# Patient Record
Sex: Female | Born: 1955 | Race: White | Hispanic: No | Smoking: Current every day smoker
Health system: Southern US, Community
[De-identification: ages and names within clinical notes are randomized; demographics above are authoritative.]

## PROBLEM LIST (undated history)

## (undated) DIAGNOSIS — Z87891 Personal history of nicotine dependence: Secondary | ICD-10-CM

## (undated) DIAGNOSIS — C50919 Malignant neoplasm of unspecified site of unspecified female breast: Secondary | ICD-10-CM

## (undated) DIAGNOSIS — C801 Malignant (primary) neoplasm, unspecified: Secondary | ICD-10-CM

## (undated) DIAGNOSIS — H04129 Dry eye syndrome of unspecified lacrimal gland: Secondary | ICD-10-CM

## (undated) DIAGNOSIS — T7840XA Allergy, unspecified, initial encounter: Secondary | ICD-10-CM

## (undated) DIAGNOSIS — E785 Hyperlipidemia, unspecified: Secondary | ICD-10-CM

## (undated) DIAGNOSIS — I10 Essential (primary) hypertension: Secondary | ICD-10-CM

## (undated) DIAGNOSIS — Z1211 Encounter for screening for malignant neoplasm of colon: Secondary | ICD-10-CM

## (undated) DIAGNOSIS — D0510 Intraductal carcinoma in situ of unspecified breast: Secondary | ICD-10-CM

## (undated) HISTORY — DX: Hyperlipidemia, unspecified: E78.5

## (undated) HISTORY — DX: Allergy, unspecified, initial encounter: T78.40XA

## (undated) HISTORY — DX: Personal history of nicotine dependence: Z87.891

## (undated) HISTORY — DX: Essential (primary) hypertension: I10

## (undated) HISTORY — DX: Encounter for screening for malignant neoplasm of colon: Z12.11

## (undated) HISTORY — DX: Malignant (primary) neoplasm, unspecified: C80.1

## (undated) HISTORY — DX: Intraductal carcinoma in situ of unspecified breast: D05.10

## (undated) HISTORY — PX: BREAST BIOPSY: SHX20

## (undated) HISTORY — DX: Dry eye syndrome of unspecified lacrimal gland: H04.129

---

## 2000-11-15 ENCOUNTER — Encounter: Payer: Self-pay | Admitting: Internal Medicine

## 2000-11-15 ENCOUNTER — Ambulatory Visit (HOSPITAL_COMMUNITY): Admission: RE | Admit: 2000-11-15 | Discharge: 2000-11-15 | Payer: Self-pay | Admitting: Internal Medicine

## 2004-09-20 ENCOUNTER — Encounter: Payer: Self-pay | Admitting: Internal Medicine

## 2004-09-20 LAB — CONVERTED CEMR LAB

## 2004-09-29 ENCOUNTER — Ambulatory Visit: Payer: Self-pay

## 2004-10-01 ENCOUNTER — Ambulatory Visit: Payer: Self-pay

## 2005-03-03 ENCOUNTER — Ambulatory Visit: Payer: Self-pay | Admitting: Internal Medicine

## 2005-04-01 ENCOUNTER — Ambulatory Visit: Payer: Self-pay | Admitting: General Surgery

## 2005-06-26 ENCOUNTER — Ambulatory Visit: Payer: Self-pay | Admitting: Internal Medicine

## 2005-09-25 ENCOUNTER — Ambulatory Visit: Payer: Self-pay | Admitting: General Surgery

## 2005-10-29 ENCOUNTER — Ambulatory Visit: Payer: Self-pay | Admitting: Internal Medicine

## 2006-09-29 ENCOUNTER — Ambulatory Visit: Payer: Self-pay

## 2006-12-10 ENCOUNTER — Ambulatory Visit: Payer: Self-pay | Admitting: Internal Medicine

## 2006-12-21 DIAGNOSIS — C50919 Malignant neoplasm of unspecified site of unspecified female breast: Secondary | ICD-10-CM

## 2006-12-21 HISTORY — DX: Malignant neoplasm of unspecified site of unspecified female breast: C50.919

## 2006-12-21 HISTORY — PX: BREAST EXCISIONAL BIOPSY: SUR124

## 2007-06-20 ENCOUNTER — Telehealth (INDEPENDENT_AMBULATORY_CARE_PROVIDER_SITE_OTHER): Payer: Self-pay | Admitting: *Deleted

## 2007-10-04 ENCOUNTER — Ambulatory Visit: Payer: Self-pay

## 2007-10-10 ENCOUNTER — Ambulatory Visit: Payer: Self-pay

## 2007-11-03 ENCOUNTER — Other Ambulatory Visit: Payer: Self-pay

## 2007-11-03 ENCOUNTER — Ambulatory Visit: Payer: Self-pay | Admitting: General Surgery

## 2007-11-04 ENCOUNTER — Ambulatory Visit: Payer: Self-pay | Admitting: Internal Medicine

## 2007-11-04 DIAGNOSIS — J45909 Unspecified asthma, uncomplicated: Secondary | ICD-10-CM | POA: Insufficient documentation

## 2007-11-04 DIAGNOSIS — R001 Bradycardia, unspecified: Secondary | ICD-10-CM | POA: Insufficient documentation

## 2007-11-04 DIAGNOSIS — J309 Allergic rhinitis, unspecified: Secondary | ICD-10-CM | POA: Insufficient documentation

## 2007-11-04 DIAGNOSIS — I10 Essential (primary) hypertension: Secondary | ICD-10-CM | POA: Insufficient documentation

## 2007-11-04 DIAGNOSIS — E785 Hyperlipidemia, unspecified: Secondary | ICD-10-CM | POA: Insufficient documentation

## 2007-11-04 DIAGNOSIS — I498 Other specified cardiac arrhythmias: Secondary | ICD-10-CM | POA: Insufficient documentation

## 2007-11-07 ENCOUNTER — Ambulatory Visit: Payer: Self-pay | Admitting: General Surgery

## 2007-11-07 ENCOUNTER — Encounter: Payer: Self-pay | Admitting: Internal Medicine

## 2007-11-07 DIAGNOSIS — C801 Malignant (primary) neoplasm, unspecified: Secondary | ICD-10-CM

## 2007-11-07 HISTORY — PX: BREAST SURGERY: SHX581

## 2007-11-07 HISTORY — DX: Malignant (primary) neoplasm, unspecified: C80.1

## 2007-11-14 ENCOUNTER — Encounter: Payer: Self-pay | Admitting: Internal Medicine

## 2007-11-22 ENCOUNTER — Encounter: Payer: Self-pay | Admitting: Internal Medicine

## 2007-12-09 ENCOUNTER — Ambulatory Visit: Payer: Self-pay | Admitting: General Surgery

## 2007-12-09 ENCOUNTER — Telehealth (INDEPENDENT_AMBULATORY_CARE_PROVIDER_SITE_OTHER): Payer: Self-pay | Admitting: *Deleted

## 2007-12-12 ENCOUNTER — Encounter: Payer: Self-pay | Admitting: Internal Medicine

## 2007-12-12 ENCOUNTER — Ambulatory Visit: Payer: Self-pay | Admitting: General Surgery

## 2007-12-19 ENCOUNTER — Encounter: Payer: Self-pay | Admitting: Internal Medicine

## 2007-12-19 ENCOUNTER — Ambulatory Visit: Payer: Self-pay | Admitting: Radiation Oncology

## 2007-12-22 ENCOUNTER — Ambulatory Visit: Payer: Self-pay | Admitting: Radiation Oncology

## 2007-12-22 HISTORY — PX: COLONOSCOPY: SHX174

## 2007-12-28 ENCOUNTER — Ambulatory Visit: Payer: Self-pay | Admitting: Radiation Oncology

## 2008-01-13 ENCOUNTER — Telehealth (INDEPENDENT_AMBULATORY_CARE_PROVIDER_SITE_OTHER): Payer: Self-pay | Admitting: *Deleted

## 2008-01-22 ENCOUNTER — Ambulatory Visit: Payer: Self-pay | Admitting: Radiation Oncology

## 2008-02-19 ENCOUNTER — Ambulatory Visit: Payer: Self-pay | Admitting: Radiation Oncology

## 2008-03-21 ENCOUNTER — Ambulatory Visit: Payer: Self-pay | Admitting: Radiation Oncology

## 2008-04-20 ENCOUNTER — Ambulatory Visit: Payer: Self-pay | Admitting: Radiation Oncology

## 2008-04-23 ENCOUNTER — Ambulatory Visit: Payer: Self-pay | Admitting: General Surgery

## 2008-05-04 ENCOUNTER — Ambulatory Visit: Payer: Self-pay | Admitting: Internal Medicine

## 2008-05-07 LAB — CONVERTED CEMR LAB
ALT: 19 units/L (ref 0–35)
AST: 21 units/L (ref 0–37)
Albumin: 4 g/dL (ref 3.5–5.2)
Alkaline Phosphatase: 66 units/L (ref 39–117)
BUN: 9 mg/dL (ref 6–23)
Basophils Absolute: 0 10*3/uL (ref 0.0–0.1)
Basophils Relative: 0.2 % (ref 0.0–1.0)
Bilirubin, Direct: 0.1 mg/dL (ref 0.0–0.3)
CO2: 30 meq/L (ref 19–32)
Calcium: 9.6 mg/dL (ref 8.4–10.5)
Chloride: 110 meq/L (ref 96–112)
Cholesterol: 209 mg/dL (ref 0–200)
Creatinine, Ser: 0.6 mg/dL (ref 0.4–1.2)
Direct LDL: 126.2 mg/dL
Eosinophils Absolute: 0.3 10*3/uL (ref 0.0–0.7)
Eosinophils Relative: 4.9 % (ref 0.0–5.0)
GFR calc Af Amer: 137 mL/min
GFR calc non Af Amer: 113 mL/min
Glucose, Bld: 100 mg/dL — ABNORMAL HIGH (ref 70–99)
HCT: 39.3 % (ref 36.0–46.0)
HDL: 47.7 mg/dL (ref >39.0)
Hemoglobin: 13.5 g/dL (ref 12.0–15.0)
Lymphocytes Relative: 16.7 % (ref 12.0–46.0)
MCHC: 34.3 g/dL (ref 30.0–36.0)
MCV: 102.6 fL — ABNORMAL HIGH (ref 78.0–100.0)
Monocytes Absolute: 0.5 10*3/uL (ref 0.1–1.0)
Monocytes Relative: 8.3 % (ref 3.0–12.0)
Neutro Abs: 3.8 10*3/uL (ref 1.4–7.7)
Neutrophils Relative %: 69.9 % (ref 43.0–77.0)
Phosphorus: 3.8 mg/dL (ref 2.3–4.6)
Platelets: 227 10*3/uL (ref 150–400)
Potassium: 3.8 meq/L (ref 3.5–5.1)
RBC: 3.83 M/uL — ABNORMAL LOW (ref 3.87–5.11)
RDW: 11.7 % (ref 11.5–14.6)
Sodium: 146 meq/L — ABNORMAL HIGH (ref 135–145)
TSH: 1.01 microintl units/mL (ref 0.35–5.50)
Total Bilirubin: 0.7 mg/dL (ref 0.3–1.2)
Total CHOL/HDL Ratio: 4.4
Total Protein: 6.7 g/dL (ref 6.0–8.3)
Triglycerides: 121 mg/dL (ref 0–149)
VLDL: 24 mg/dL (ref 0–40)
WBC: 5.5 10*3/uL (ref 4.5–10.5)

## 2008-05-10 ENCOUNTER — Encounter: Payer: Self-pay | Admitting: Internal Medicine

## 2008-06-19 ENCOUNTER — Telehealth (INDEPENDENT_AMBULATORY_CARE_PROVIDER_SITE_OTHER): Payer: Self-pay | Admitting: *Deleted

## 2008-08-06 ENCOUNTER — Encounter: Payer: Self-pay | Admitting: Internal Medicine

## 2008-08-06 ENCOUNTER — Ambulatory Visit: Payer: Self-pay | Admitting: General Surgery

## 2008-08-07 ENCOUNTER — Telehealth: Payer: Self-pay | Admitting: Internal Medicine

## 2008-09-20 ENCOUNTER — Ambulatory Visit: Payer: Self-pay | Admitting: Radiation Oncology

## 2008-09-25 ENCOUNTER — Telehealth: Payer: Self-pay | Admitting: Internal Medicine

## 2008-09-26 ENCOUNTER — Encounter: Payer: Self-pay | Admitting: Internal Medicine

## 2008-10-03 ENCOUNTER — Ambulatory Visit: Payer: Self-pay | Admitting: General Surgery

## 2008-10-12 ENCOUNTER — Encounter: Payer: Self-pay | Admitting: Internal Medicine

## 2008-10-15 ENCOUNTER — Encounter: Payer: Self-pay | Admitting: Internal Medicine

## 2008-11-21 ENCOUNTER — Ambulatory Visit: Payer: Self-pay | Admitting: Internal Medicine

## 2008-12-20 ENCOUNTER — Telehealth: Payer: Self-pay | Admitting: Family Medicine

## 2009-01-15 ENCOUNTER — Encounter: Payer: Self-pay | Admitting: Internal Medicine

## 2009-01-29 ENCOUNTER — Ambulatory Visit: Payer: Self-pay | Admitting: Emergency Medicine

## 2009-02-18 ENCOUNTER — Telehealth: Payer: Self-pay | Admitting: Internal Medicine

## 2009-07-05 ENCOUNTER — Ambulatory Visit: Payer: Self-pay | Admitting: Internal Medicine

## 2009-07-05 DIAGNOSIS — G2581 Restless legs syndrome: Secondary | ICD-10-CM | POA: Insufficient documentation

## 2009-07-08 LAB — CONVERTED CEMR LAB
ALT: 13 units/L (ref 0–35)
AST: 16 units/L (ref 0–37)
Albumin: 4.2 g/dL (ref 3.5–5.2)
Alkaline Phosphatase: 91 units/L (ref 39–117)
BUN: 16 mg/dL (ref 6–23)
Basophils Absolute: 0 10*3/uL (ref 0.0–0.1)
Basophils Relative: 1 % (ref 0–1)
CO2: 23 meq/L (ref 19–32)
Calcium: 9.1 mg/dL (ref 8.4–10.5)
Chloride: 109 meq/L (ref 96–112)
Creatinine, Ser: 1 mg/dL (ref 0.40–1.20)
Eosinophils Absolute: 0.3 10*3/uL (ref 0.0–0.7)
Eosinophils Relative: 5 % (ref 0–5)
Glucose, Bld: 90 mg/dL (ref 70–99)
HCT: 37.6 % (ref 36.0–46.0)
Hemoglobin: 13.1 g/dL (ref 12.0–15.0)
Lymphocytes Relative: 21 % (ref 12–46)
Lymphs Abs: 1.3 10*3/uL (ref 0.7–4.0)
MCHC: 34.8 g/dL (ref 30.0–36.0)
MCV: 98.4 fL (ref 78.0–100.0)
Monocytes Absolute: 0.4 10*3/uL (ref 0.1–1.0)
Monocytes Relative: 7 % (ref 3–12)
Neutro Abs: 4.1 10*3/uL (ref 1.7–7.7)
Neutrophils Relative %: 66 % (ref 43–77)
Platelets: 262 10*3/uL (ref 150–400)
Potassium: 4.2 meq/L (ref 3.5–5.3)
RBC: 3.82 M/uL — ABNORMAL LOW (ref 3.87–5.11)
RDW: 13.1 % (ref 11.5–15.5)
Sodium: 144 meq/L (ref 135–145)
TSH: 0.945 microintl units/mL (ref 0.350–4.500)
Total Bilirubin: 0.3 mg/dL (ref 0.3–1.2)
Total Protein: 7 g/dL (ref 6.0–8.3)
WBC: 6.2 10*3/uL (ref 4.0–10.5)

## 2009-07-15 ENCOUNTER — Encounter: Payer: Self-pay | Admitting: Internal Medicine

## 2009-10-07 ENCOUNTER — Ambulatory Visit: Payer: Self-pay | Admitting: General Surgery

## 2009-10-15 ENCOUNTER — Encounter: Payer: Self-pay | Admitting: Internal Medicine

## 2009-11-27 ENCOUNTER — Telehealth (INDEPENDENT_AMBULATORY_CARE_PROVIDER_SITE_OTHER): Payer: Self-pay | Admitting: *Deleted

## 2010-08-12 ENCOUNTER — Ambulatory Visit: Payer: Self-pay | Admitting: Internal Medicine

## 2010-08-14 LAB — CONVERTED CEMR LAB
ALT: 14 units/L (ref 0–35)
AST: 15 units/L (ref 0–37)
Albumin: 4.4 g/dL (ref 3.5–5.2)
Alkaline Phosphatase: 76 units/L (ref 39–117)
BUN: 20 mg/dL (ref 6–23)
Basophils Absolute: 0 10*3/uL (ref 0.0–0.1)
Basophils Relative: 1 % (ref 0–1)
CO2: 26 meq/L (ref 19–32)
Calcium: 9.7 mg/dL (ref 8.4–10.5)
Chloride: 105 meq/L (ref 96–112)
Creatinine, Ser: 0.81 mg/dL (ref 0.40–1.20)
Eosinophils Absolute: 0.3 10*3/uL (ref 0.0–0.7)
Eosinophils Relative: 4 % (ref 0–5)
Glucose, Bld: 110 mg/dL — ABNORMAL HIGH (ref 70–99)
HCT: 36 % (ref 36.0–46.0)
Hemoglobin: 12 g/dL (ref 12.0–15.0)
Lymphocytes Relative: 22 % (ref 12–46)
Lymphs Abs: 1.4 10*3/uL (ref 0.7–4.0)
MCHC: 33.3 g/dL (ref 30.0–36.0)
MCV: 98.9 fL (ref 78.0–100.0)
Monocytes Absolute: 0.5 10*3/uL (ref 0.1–1.0)
Monocytes Relative: 8 % (ref 3–12)
Neutro Abs: 4.4 10*3/uL (ref 1.7–7.7)
Neutrophils Relative %: 66 % (ref 43–77)
Platelets: 274 10*3/uL (ref 150–400)
Potassium: 4.4 meq/L (ref 3.5–5.3)
RBC: 3.64 M/uL — ABNORMAL LOW (ref 3.87–5.11)
RDW: 12.7 % (ref 11.5–15.5)
Sodium: 140 meq/L (ref 135–145)
TSH: 1.107 microintl units/mL (ref 0.350–4.500)
Total Bilirubin: 0.3 mg/dL (ref 0.3–1.2)
Total Protein: 6.9 g/dL (ref 6.0–8.3)
WBC: 6.6 10*3/uL (ref 4.0–10.5)

## 2010-10-09 ENCOUNTER — Ambulatory Visit: Payer: Self-pay | Admitting: General Surgery

## 2010-10-16 ENCOUNTER — Encounter: Payer: Self-pay | Admitting: Internal Medicine

## 2010-10-20 ENCOUNTER — Telehealth: Payer: Self-pay | Admitting: Internal Medicine

## 2011-01-20 NOTE — Assessment & Plan Note (Signed)
Summary: med refill/dlo R/S FROM 08/15/10   Vital Signs:  Patient profile:   55 year old female Weight:      139 pounds BMI:     24.71 Temp:     98.2 degrees F oral Pulse rate:   76 / minute Pulse rhythm:   regular BP sitting:   128 / 80  (left arm) Cuff size:   regular  Vitals Entered By: Mervin Hack CMA Duncan Dull) (August 12, 2010 11:07 AM) CC: medication refill   History of Present Illness: DOing okay  Dr Welton Flakes has changed her emergency inhaler He wants to change her regular inhaler as well She likes the powder of the advair Does have some mild chronic cough Very sensitive to odors---perfume, candles, etc Trying to use peak flow meter to monitor has chronic SOB  No problems with BP meds No chest pain  Still as leg pain--random Not with exertion No swelling  Using singulair and other allergy regimen  Still sees Dr Lemar Livings for breast exams Gyn is Dr Dingfielter in [place]    Allergies: 1)  Altace (Ramipril) 2)  Levaquin (Levofloxacin) 3)  Qvar (Beclomethasone Dipropionate)  Past History:  Past medical, surgical, family and social histories (including risk factors) reviewed for relevance to current acute and chronic problems.  Past Medical History: Reviewed history from 07/05/2009 and no changes required. Allergic/irritant  rhinitis Asthma--cough variant Hyperlipidemia Hypertension DCIS-----------partial mastectomy and RT-----------------Dr Byrnett  [facility])   [phone]  Past Surgical History: Reviewed history from 05/04/2008 and no changes required. 1998    Breast biopsy negative - Byrnett 3/00     Left breast biopsy- stereotactic 10/05   Left breast biopsy - (-) Byrnett 11/08   Left breast biopsy--L partial mastectomy for DCIS  Family History: Reviewed history from 12/19/2007 and no changes required. Father: Died 1998-12-18 coronary disease, HTN, DM, glaucoma, diabetic retinopathy,  Mother: HTN, rheumatoid  arthritis Maternal GM--  Coronary disease Maternal GF--  died of CA  Social History: Reviewed history from 12/19/2007 and no changes required. Marital Status: Married Children: None Occupation: Financial planner Alcohol use-yes, occasional Current Smoker, 4-5 a day, discussed  Review of Systems       Lesion on left foot---Dr Regal tried to aspirate fluid but didn't get any weight is stable sleeps okay unless legs act up  Physical Exam  General:  alert and normal appearance.   Neck:  supple, no masses, no thyromegaly, no carotid bruits, and no cervical lymphadenopathy.   Lungs:  normal respiratory effort, no intercostal retractions, no accessory muscle use, normal breath sounds, no crackles, and no wheezes.   Heart:  normal rate, regular rhythm, no murmur, and no gallop.   Pulses:  1+ in feet Extremities:  no edema Skin:  apparent cyst on dorsum of left foot Psych:  normally interactive, good eye contact, not anxious appearing, and not depressed appearing.     Impression & Recommendations:  Problem # 1:  HYPERTENSION (ICD-401.9) Assessment Unchanged  good control due for labs  Her updated medication list for this problem includes:    Cozaar 100 Mg Tabs (Losartan potassium) .Marland Kitchen... 1 tab daily for high blood pressure    Ziac 10-6.25 Mg Tabs (Bisoprolol-hydrochlorothiazide) .Marland Kitchen... Take one by mouth once a day    Norvasc 10 Mg Tabs (Amlodipine besylate) .Marland Kitchen... Take one by mouth once a day  BP today: 128/80 Prior BP: 128/80 (07/05/2009)  Labs Reviewed: K+: 4.2 (07/05/2009) Creat: : 1.00 (07/05/2009)  Chol: 209 (05/04/2008)   HDL: 47.7 (05/04/2008)   LDL: DEL (05/04/2008)   TG: 121 (05/04/2008)  Orders: TLB-Renal Function Panel (80069-RENAL) TLB-CBC Platelet - w/Differential (85025-CBCD) TLB-Hepatic/Liver Function Pnl (80076-HEPATIC) TLB-TSH (Thyroid Stimulating Hormone) (84443-TSH) Venipuncture (16109)  Problem # 2:  RESTLESS LEG SYNDROME  (ICD-333.94) Assessment: Comment Only does have some daytime symptoms Not clear cut will renew the clonazepam that she uses occ  Problem # 3:  HYPERLIPIDEMIA (ICD-272.4) Assessment: Comment Only just  on fish oil  Labs Reviewed: SGOT: 16 (07/05/2009)   SGPT: 13 (07/05/2009)   HDL:47.7 (05/04/2008)  LDL:DEL (05/04/2008)  Chol:209 (05/04/2008)  Trig:121 (05/04/2008)  Problem # 4:  ASTHMA (ICD-493.90) Assessment: Comment Only ongoing cough Dr Welton Flakes manages  The following medications were removed from the medication list:    Proair Hfa 108 (90 Base) Mcg/act Aers (Albuterol sulfate) .Marland Kitchen... 2 puffs 4 times daily as needed Her updated medication list for this problem includes:    Advair Diskus 100-50 Mcg/dose Misc (Fluticasone-salmeterol) ..... One inhalation twice a day.    Singulair 5 Mg Chew (Montelukast sodium) .Marland Kitchen... As needed  Complete Medication List: 1)  Cozaar 100 Mg Tabs (Losartan potassium) .Marland Kitchen.. 1 tab daily for high blood pressure 2)  Clonazepam 0.5 Mg Tabs (Clonazepam) .Marland Kitchen.. 1-2 at bedtime as needed for restless legs 3)  Ziac 10-6.25 Mg Tabs (Bisoprolol-hydrochlorothiazide) .... Take one by mouth once a day 4)  Norvasc 10 Mg Tabs (Amlodipine besylate) .... Take one by mouth once a day 5)  Fish Oil 1000 Mg Caps (Omega-3 fatty acids) .... Take 1 by mouth once daily 6)  Advair Diskus 100-50 Mcg/dose Misc (Fluticasone-salmeterol) .... One inhalation twice a day. 7)  Singulair 5 Mg Chew (Montelukast sodium) .... As needed 8)  Nasacort Aq 55 Mcg/act Aers (Triamcinolone acetonide(nasal)) .... As needed 9)  Potassium 75 Mg Tabs (Potassium) .... Once daily 10)  Zyrtec-d Allergy & Congestion 5-120 Mg Tb12 (Cetirizine-pseudoephedrine) .... Take one by mouth two times a day as needed for allergies 11)  Iron Supplement 325 (65 Fe) Mg Tabs (Ferrous sulfate) .... Once daily 12)  Calcium 500 Mg Tabs (Calcium carbonate) .... Take 1 by mouth once daily  Patient Instructions: 1)  Please  schedule a follow-up appointment in 1 year for physical Prescriptions: CLONAZEPAM 0.5 MG  TABS (CLONAZEPAM) 1-2 at bedtime as needed for restless legs  #60 x 0   Entered and Authorized by:   Cindee Salt MD   Signed by:   Cindee Salt MD on 08/12/2010   Method used:   Print then Give to Patient   RxID:   [id number]   Current Allergies (reviewed today): ALTACE (RAMIPRIL) LEVAQUIN (LEVOFLOXACIN) QVAR (BECLOMETHASONE DIPROPIONATE)  Appended Document: Orders Update    Clinical Lists Changes  Orders: Added new Service order of Specimen Handling (95621) - Signed Added new Test order of T-Hepatic Function [phone]) - Signed Added new Test order of T-Renal Function Panel [phone]) - Signed Added new Test order of T-CBC w/Diff [phone]) - Signed Added new Test order of T-TSH ([id number]) - Signed

## 2011-01-20 NOTE — Letter (Signed)
Summary: [facility]  [facility]   Imported By: Maryln Gottron 10/27/2010 13:17:34  _____________________________________________________________________  External Attachment:    Type:   Image     Comment:   External Document  Appended Document:  [facility]    Clinical Lists Changes  Observations: Added new observation of MAMMOGRAM: No specific mammographic evidence of malignancy.  Assessment: BIRADS 2. Dr Lemar Livings 1 year follow up--had removal of DCIS (10/09/2010 17:35)       Mammogram  Procedure date:  10/09/2010  Findings:      No specific mammographic evidence of malignancy.  Assessment: BIRADS 2. Dr Lemar Livings 1 year follow up--had removal of DCIS

## 2011-01-20 NOTE — Progress Notes (Signed)
Summary: Rx Spacer  Phone Note From Pharmacy Call back at [phone]   Caller: [facility] [address] # [phone]* Call For: Dr. Alphonsus Sias  Summary of Call: Patient is requesting a rx for a spacer to use with her inhaler.  Please send rx to pharmacy. Initial call taken by: Sydell Axon LPN,  October 20, 2010 1:32 PM  Follow-up for Phone Call        okay to call in Rx there are multiple in computer so hard to do electronically Just call in for reasonable generic spacer #1 x 0 Use with inhalers Follow-up by: Cindee Salt MD,  October 20, 2010 1:59 PM  Additional Follow-up for Phone Call Additional follow up Details #1::        rx sent to pharmacy Additional Follow-up by: Mervin Hack CMA Duncan Dull),  October 20, 2010 3:15 PM    New/Updated Medications: INSPIREASE MOUTHPIECE  MISC (SPACER/AERO CHAMBER MOUTHPIECE) use with inhaler Prescriptions: INSPIREASE MOUTHPIECE  MISC (SPACER/AERO CHAMBER MOUTHPIECE) use with inhaler  #1 x 0   Entered by:   Mervin Hack CMA (AAMA)   Authorized by:   Cindee Salt MD   Signed by:   Mervin Hack CMA (AAMA) on 10/20/2010   Method used:   Electronically to        [facility]. # [phone]* (retail)       [address]       [place], [place]  [address]       Ph: [phone]       Fax: [phone]   RxID:   [id number]

## 2011-06-30 ENCOUNTER — Encounter: Payer: Self-pay | Admitting: Internal Medicine

## 2011-07-01 ENCOUNTER — Ambulatory Visit (INDEPENDENT_AMBULATORY_CARE_PROVIDER_SITE_OTHER): Payer: PRIVATE HEALTH INSURANCE | Admitting: Internal Medicine

## 2011-07-01 ENCOUNTER — Encounter: Payer: Self-pay | Admitting: Internal Medicine

## 2011-07-01 DIAGNOSIS — E785 Hyperlipidemia, unspecified: Secondary | ICD-10-CM

## 2011-07-01 DIAGNOSIS — G2581 Restless legs syndrome: Secondary | ICD-10-CM

## 2011-07-01 DIAGNOSIS — J45909 Unspecified asthma, uncomplicated: Secondary | ICD-10-CM

## 2011-07-01 DIAGNOSIS — J309 Allergic rhinitis, unspecified: Secondary | ICD-10-CM

## 2011-07-01 DIAGNOSIS — I10 Essential (primary) hypertension: Secondary | ICD-10-CM

## 2011-07-01 LAB — LIPID PANEL
HDL: 61.2 mg/dL (ref >39.00)
Total CHOL/HDL Ratio: 4
Triglycerides: 129 mg/dL (ref 0.0–149.0)

## 2011-07-01 LAB — CBC WITH DIFFERENTIAL/PLATELET
Basophils Absolute: 0 10*3/uL (ref 0.0–0.1)
Basophils Relative: 0.5 % (ref 0.0–3.0)
Hemoglobin: 13.5 g/dL (ref 12.0–15.0)
Lymphocytes Relative: 15.9 % (ref 12.0–46.0)
Monocytes Relative: 6.2 % (ref 3.0–12.0)
Neutro Abs: 6.1 10*3/uL (ref 1.4–7.7)
RBC: 3.8 Mil/uL — ABNORMAL LOW (ref 3.87–5.11)
WBC: 8.3 10*3/uL (ref 4.5–10.5)

## 2011-07-01 LAB — BASIC METABOLIC PANEL
Calcium: 9.7 mg/dL (ref 8.4–10.5)
GFR: 103.17 mL/min (ref >60.00)
Sodium: 142 mEq/L (ref 135–145)

## 2011-07-01 LAB — HEPATIC FUNCTION PANEL
Alkaline Phosphatase: 75 U/L (ref 39–117)
Bilirubin, Direct: 0 mg/dL (ref 0.0–0.3)
Total Protein: 7.8 g/dL (ref 6.0–8.3)

## 2011-07-01 MED ORDER — AMLODIPINE BESYLATE 10 MG PO TABS: 10.0000 mg | ORAL_TABLET | Freq: Every day | ORAL | Status: DC

## 2011-07-01 MED ORDER — LOSARTAN POTASSIUM 100 MG PO TABS: 100.0000 mg | ORAL_TABLET | Freq: Every day | ORAL | Status: DC

## 2011-07-01 MED ORDER — CETIRIZINE-PSEUDOEPHEDRINE ER 5-120 MG PO TB12: 1.0000 | ORAL_TABLET | Freq: Two times a day (BID) | ORAL | Status: DC

## 2011-07-01 MED ORDER — BISOPROLOL-HYDROCHLOROTHIAZIDE 10-6.25 MG PO TABS: 1.0000 | ORAL_TABLET | Freq: Every day | ORAL | Status: DC

## 2011-07-01 NOTE — Assessment & Plan Note (Signed)
Just on fish oil No results found for this basename: LDLCALC

## 2011-07-01 NOTE — Assessment & Plan Note (Signed)
Okay to try med from Dr Welton Flakes Right now she just rarely uses the clonazepam

## 2011-07-01 NOTE — Assessment & Plan Note (Signed)
Reasonable control on regimen

## 2011-07-01 NOTE — Assessment & Plan Note (Signed)
Starting immunotherapy with Dr Welton Flakes

## 2011-07-01 NOTE — Progress Notes (Signed)
Subjective:    Patient ID: Brandy Hamilton, female    DOB: 1956-07-25, 55 y.o.   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 amLODipine (NORVASC) 10 MG tablet Take 10 mg by mouth daily.        . bisoprolol-hydrochlorothiazide (ZIAC) 10-6.25 MG per tablet Take 1 tablet by mouth daily.        . Calcium Carbonate (CALCIUM 500 PO) Take by mouth daily.        . cetirizine-pseudoephedrine (ZYRTEC-D) 5-120 MG per tablet Take 1 tablet by mouth 2 (two) times daily.        . clonazePAM (KLONOPIN) 0.5 MG tablet Take 0.5-1 mg by mouth as needed.        . ferrous gluconate (FERGON) 325 MG tablet Take 325 mg by mouth daily with breakfast.        . fish oil-omega-3 fatty acids 1000 MG capsule Take 2 g by mouth daily.        . Fluticasone-Salmeterol (ADVAIR DISKUS) 100-50 MCG/DOSE AEPB Inhale 1 puff into the lungs every 12 (twelve) hours.        Marland Kitchen losartan (COZAAR) 100 MG tablet Take 100 mg by mouth daily.        . montelukast (SINGULAIR) 5 MG chewable tablet Chew 5 mg by mouth as needed.        . Potassium 75 MG TABS Take by mouth daily.        Marland Kitchen triamcinolone (NASACORT) 55 MCG/ACT nasal inhaler Place 2 sprays into the nose as needed.          Allergies  Allergen Reactions  . Beclomethasone Dipropionate   . Levofloxacin   . Ramipril     REACTION: Cough    Past Medical History  Diagnosis Date  .  Allergy   . Asthma   . Hyperlipidemia   . Hypertension   . DCIS (ductal carcinoma in situ) of breast     partial mastectomy and RT-----------------Dr Lemar Livings    Past Surgical History  Procedure Date  . Breast biopsy     1998    Breast biopsy negative - Byrnett, 3/00     Left breast biopsy- stereotactic10/05   Left breast biopsy - (-) Byrnett11/08   Left breast biopsy--L partial mastectomy for DCIS, ,     Family History  Problem Relation Age of Onset  . Cancer Maternal Grandfather     History   Social History  . Marital Status: Legally Separated    Spouse Name: N/A    Number of Children: N/A  . Years of Education: N/A   Occupational History  . Computer Support - [employer]    Social History Main Topics  . Smoking status: Current Everyday Smoker  . Smokeless tobacco: Never Used  . Alcohol Use: Yes  . Drug Use: No  . Sexually Active: Not on  file   Other Topics Concern  . Not on file   Social History Narrative  . No narrative on file   Review of Systems Had chronic piece of glass on top of left foot---finally worked it out Still has problems with feet at night--got med from Dr Welton Flakes for Parkinsons but she was afraid to take it. Clonazepam did help and she rarely uses them Has lost 8# since last year--has been more active since retirement    Objective:   Physical Exam  Constitutional: She appears well-developed and well-nourished. No distress.  Neck: Normal range of motion. Neck supple. No thyromegaly present.  Cardiovascular: Normal rate, regular rhythm, normal heart sounds and intact distal pulses.  Exam reveals no gallop.   No murmur heard. Pulmonary/Chest: Effort normal and breath sounds normal. No respiratory distress. She has no wheezes. She has no rales.  Abdominal: Soft. There is no tenderness.  Musculoskeletal: Normal range of motion. She exhibits no edema and no tenderness.  Lymphadenopathy:    She has no cervical adenopathy.  Psychiatric: She has a  normal mood and affect. Her behavior is normal. Judgment and thought content normal.          Assessment & Plan:

## 2011-07-01 NOTE — Assessment & Plan Note (Signed)
BP Readings from Last 3 Encounters:  07/01/11 118/70  08/12/10 128/80  07/05/09 128/80   BP is fine Will check labs No changes

## 2011-10-06 ENCOUNTER — Ambulatory Visit: Payer: Self-pay | Admitting: General Surgery

## 2011-12-21 ENCOUNTER — Other Ambulatory Visit: Payer: Self-pay | Admitting: Internal Medicine

## 2011-12-21 MED ORDER — CLONAZEPAM 0.5 MG PO TABS: 0.2500 mg | ORAL_TABLET | Freq: Every day | ORAL | Status: DC | PRN

## 2011-12-21 NOTE — Telephone Encounter (Signed)
rx called into pharmacy

## 2011-12-21 NOTE — Telephone Encounter (Signed)
Okay #30 x 0 Should be 1/2-1 daily at bedtime prn for restless legs

## 2011-12-21 NOTE — Telephone Encounter (Signed)
Refill request on Clonazepam 

## 2011-12-23 ENCOUNTER — Other Ambulatory Visit: Payer: Self-pay | Admitting: *Deleted

## 2011-12-23 MED ORDER — BISOPROLOL-HYDROCHLOROTHIAZIDE 10-6.25 MG PO TABS: 1.0000 | ORAL_TABLET | Freq: Every day | ORAL | Status: DC

## 2011-12-23 MED ORDER — AMLODIPINE BESYLATE 10 MG PO TABS: 10.0000 mg | ORAL_TABLET | Freq: Every day | ORAL | Status: DC

## 2011-12-23 MED ORDER — LOSARTAN POTASSIUM 100 MG PO TABS: 100.0000 mg | ORAL_TABLET | Freq: Every day | ORAL | Status: DC

## 2011-12-23 MED ORDER — CETIRIZINE-PSEUDOEPHEDRINE ER 5-120 MG PO TB12: 1.0000 | ORAL_TABLET | Freq: Two times a day (BID) | ORAL | Status: DC

## 2011-12-23 NOTE — Telephone Encounter (Signed)
rx's signed by Dr.Copland, Spoke with patient and advised results

## 2011-12-23 NOTE — Telephone Encounter (Signed)
Patient dropped off form asking to have refills printed to be mailed to a new mail order pharmacy. rx's on your desk to be signed since Dr.Letvak is out.

## 2012-06-30 ENCOUNTER — Ambulatory Visit (INDEPENDENT_AMBULATORY_CARE_PROVIDER_SITE_OTHER): Payer: PRIVATE HEALTH INSURANCE | Admitting: Internal Medicine

## 2012-06-30 ENCOUNTER — Encounter: Payer: Self-pay | Admitting: Internal Medicine

## 2012-06-30 VITALS — BP 140/80 | HR 63 | Temp 98.1°F | Ht 63.0 in | Wt 141.0 lb

## 2012-06-30 DIAGNOSIS — J45909 Unspecified asthma, uncomplicated: Secondary | ICD-10-CM

## 2012-06-30 DIAGNOSIS — E785 Hyperlipidemia, unspecified: Secondary | ICD-10-CM

## 2012-06-30 DIAGNOSIS — Z1211 Encounter for screening for malignant neoplasm of colon: Secondary | ICD-10-CM | POA: Insufficient documentation

## 2012-06-30 DIAGNOSIS — Z0000 Encounter for general adult medical examination without abnormal findings: Secondary | ICD-10-CM

## 2012-06-30 DIAGNOSIS — G2581 Restless legs syndrome: Secondary | ICD-10-CM

## 2012-06-30 DIAGNOSIS — I10 Essential (primary) hypertension: Secondary | ICD-10-CM

## 2012-06-30 MED ORDER — CLONAZEPAM 0.5 MG PO TABS: 0.2500 mg | ORAL_TABLET | Freq: Every day | ORAL | Status: DC | PRN

## 2012-06-30 NOTE — Assessment & Plan Note (Signed)
BP Readings from Last 3 Encounters:  06/30/12 140/80  07/01/11 118/70  08/12/10 128/80   Good control still No changes

## 2012-06-30 NOTE — Assessment & Plan Note (Signed)
Rarely uses the clonazepam 

## 2012-06-30 NOTE — Assessment & Plan Note (Signed)
Sounds mild and intermittent Dr Welton Flakes manages

## 2012-06-30 NOTE — Assessment & Plan Note (Addendum)
Generally healthy Discussed fitness UTD on cancer screening Working on stopping smoking---has cut down

## 2012-06-30 NOTE — Assessment & Plan Note (Signed)
Discussed primary prevention Will recheck but no meds except fish oil for now

## 2012-06-30 NOTE — Progress Notes (Signed)
Subjective:    Patient ID: Brandy Hamilton, female    DOB: 02-07-1956, 56 y.o.   MRN: [id number]  HPI Here for physical Still sees Dr Lemar Livings for mammograms Sees gyn every October also  Has left hand pain for a while Thumb and up into forearm Hard to lift weight or move it suddenly Aleve helps some "feels like it is on fire" Thumb brace helps some  Still sees Dr Welton Flakes for her allergies and asthma Now on allergy shots for a year--now on maintenance Still affected by perfumes and chemical odors  Current Outpatient Prescriptions on File Prior to Visit  Medication Sig Dispense Refill  . amLODipine (NORVASC) 10 MG tablet Take 1 tablet (10 mg total) by mouth daily.  90 tablet  3  . bisoprolol-hydrochlorothiazide (ZIAC) 10-6.25 MG per tablet Take 1 tablet by mouth daily.  90 tablet  3  . Calcium Carbonate (CALCIUM 500 PO) Take by mouth daily.        . cetirizine-pseudoephedrine (ZYRTEC-D) 5-120 MG per tablet Take 1 tablet by mouth 2 (two) times daily.  180 tablet  3  . clonazePAM (KLONOPIN) 0.5 MG tablet Take 0.5-1 tablets (0.25-0.5 mg total) by mouth daily as needed.  30 tablet  0  . ferrous gluconate (FERGON) 325 MG tablet Take 325 mg by mouth daily with breakfast.        . fish oil-omega-3 fatty acids 1000 MG capsule Take 2 g by mouth daily.        . Fluticasone-Salmeterol (ADVAIR DISKUS) 100-50 MCG/DOSE AEPB Inhale 1 puff into the lungs every 12 (twelve) hours.        Marland Kitchen losartan (COZAAR) 100 MG tablet Take 1 tablet (100 mg total) by mouth daily.  90 tablet  3  . montelukast (SINGULAIR) 5 MG chewable tablet Chew 5 mg by mouth as needed.        . Potassium 75 MG TABS Take by mouth daily.        Marland Kitchen triamcinolone (NASACORT) 55 MCG/ACT nasal inhaler Place 2 sprays into the nose as needed.          Allergies  Allergen Reactions  . Beclomethasone Dipropionate   . Levofloxacin   . Ramipril     REACTION: Cough    Past Medical History  Diagnosis Date  . Allergy   . Asthma   .  Hyperlipidemia   . Hypertension   . DCIS (ductal carcinoma in situ) of breast     partial mastectomy and RT-----------------Dr Lemar Livings    Past Surgical History  Procedure Date  . Breast biopsy     1998    Breast biopsy negative - Byrnett, 3/00     Left breast biopsy- stereotactic10/05   Left breast biopsy - (-) Byrnett11/08   Left breast biopsy--L partial mastectomy for DCIS, ,     Family History  Problem Relation Age of Onset  . Cancer Maternal Grandfather     History   Social History  . Marital Status: Divorced    Spouse Name: N/A    Number of Children: 0  . Years of Education: N/A   Occupational History  . 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 Smoking status: Current Some Day Smoker  . Smokeless tobacco: Never Used   Comment: discussed cold [place] stopping since not an everyday smoker  . Alcohol Use: Yes  .  Drug Use: No  . Sexually Active: Not on file   Other Topics Concern  . Not on file   Social History Narrative  . No narrative on file   Review of Systems  Constitutional: Negative for fatigue and unexpected weight change.       Wears seat belt  HENT: Positive for congestion and rhinorrhea. Negative for hearing loss, dental problem and tinnitus.        Regular with dentist  Eyes: Negative for visual disturbance.       No diplopia or unilateral vision loss  Respiratory: Positive for cough and wheezing. Negative for shortness of breath.        Occ wheeze or cough--xopenex helps  Cardiovascular: Negative for chest pain, palpitations and leg swelling.  Gastrointestinal: Negative for nausea, vomiting, abdominal pain, constipation and blood in stool.       Rare heartburn  Genitourinary: Negative for dysuria, difficulty urinating and dyspareunia.  Musculoskeletal: Positive for arthralgias. Negative for back pain and joint swelling.  Skin: Negative for rash.       No  suspicious lesion  Neurological: Positive for weakness. Negative for dizziness, syncope, light-headedness, numbness and headaches.       Left hand  Psychiatric/Behavioral: Positive for disturbed wake/sleep cycle. Negative for dysphoric mood. The patient is not nervous/anxious.        Some sleep problems----sleeps in and some inversion (can sleep in day better at times)       Objective:   Physical Exam  Constitutional: She is oriented to person, place, and time. She appears well-developed and well-nourished. No distress.  HENT:  Head: Normocephalic and atraumatic.  Right Ear: External ear normal.  Left Ear: External ear normal.  Mouth/Throat: Oropharynx is clear and moist. No oropharyngeal exudate.  Eyes: Conjunctivae and EOM are normal. Pupils are equal, round, and reactive to light.  Neck: Normal range of motion. Neck supple. No thyromegaly present.  Cardiovascular: Normal rate, regular rhythm, normal heart sounds and intact distal pulses.  Exam reveals no gallop.   No murmur heard. Pulmonary/Chest: Effort normal and breath sounds normal. No respiratory distress. She has no wheezes. She has no rales.  Abdominal: Soft. There is no tenderness.  Musculoskeletal: She exhibits no edema and no tenderness.  Lymphadenopathy:    She has no cervical adenopathy.    She has no axillary adenopathy.  Neurological: She is alert and oriented to person, place, and time.  Skin: No rash noted. No erythema.  Psychiatric: She has a normal mood and affect. Her behavior is normal. Thought content normal.          Assessment & Plan:

## 2012-07-01 LAB — CBC WITH DIFFERENTIAL/PLATELET
Basophils Relative: 0.9 % (ref 0.0–3.0)
Eosinophils Absolute: 0.2 10*3/uL (ref 0.0–0.7)
Eosinophils Relative: 3.6 % (ref 0.0–5.0)
HCT: 37.9 % (ref 36.0–46.0)
Hemoglobin: 12.8 g/dL (ref 12.0–15.0)
Lymphs Abs: 1.2 10*3/uL (ref 0.7–4.0)
MCHC: 33.7 g/dL (ref 30.0–36.0)
MCV: 100.5 fl — ABNORMAL HIGH (ref 78.0–100.0)
Monocytes Absolute: 0.1 10*3/uL (ref 0.1–1.0)
Neutro Abs: 5 10*3/uL (ref 1.4–7.7)
Neutrophils Relative %: 75 % (ref 43.0–77.0)
RBC: 3.77 Mil/uL — ABNORMAL LOW (ref 3.87–5.11)

## 2012-07-01 LAB — BASIC METABOLIC PANEL
CO2: 25 mEq/L (ref 19–32)
Chloride: 106 mEq/L (ref 96–112)
Creatinine, Ser: 0.8 mg/dL (ref 0.4–1.2)
Potassium: 4.1 mEq/L (ref 3.5–5.1)

## 2012-07-01 LAB — HEPATIC FUNCTION PANEL
ALT: 17 U/L (ref 0–35)
AST: 21 U/L (ref 0–37)
Alkaline Phosphatase: 74 U/L (ref 39–117)
Total Bilirubin: 0.7 mg/dL (ref 0.3–1.2)

## 2012-07-01 LAB — TSH: TSH: 0.95 u[IU]/mL (ref 0.35–5.50)

## 2012-07-01 LAB — LIPID PANEL
Cholesterol: 238 mg/dL — ABNORMAL HIGH (ref 0–200)
Total CHOL/HDL Ratio: 4

## 2012-07-01 LAB — LDL CHOLESTEROL, DIRECT: Direct LDL: 145.4 mg/dL

## 2012-07-04 ENCOUNTER — Encounter: Payer: Self-pay | Admitting: *Deleted

## 2012-10-06 ENCOUNTER — Other Ambulatory Visit: Payer: Self-pay | Admitting: *Deleted

## 2012-10-06 MED ORDER — AMLODIPINE BESYLATE 10 MG PO TABS: 10.0000 mg | ORAL_TABLET | Freq: Every day | ORAL | Status: DC

## 2012-10-06 MED ORDER — LOSARTAN POTASSIUM 100 MG PO TABS: 100.0000 mg | ORAL_TABLET | Freq: Every day | ORAL | Status: DC

## 2012-10-06 MED ORDER — BISOPROLOL-HYDROCHLOROTHIAZIDE 10-6.25 MG PO TABS: 1.0000 | ORAL_TABLET | Freq: Every day | ORAL | Status: DC

## 2012-10-06 NOTE — Telephone Encounter (Signed)
rx sent to pharmacy by e-script, [facility]

## 2012-10-10 ENCOUNTER — Ambulatory Visit: Payer: Self-pay | Admitting: General Surgery

## 2012-12-27 ENCOUNTER — Other Ambulatory Visit: Payer: Self-pay | Admitting: *Deleted

## 2012-12-27 MED ORDER — CETIRIZINE-PSEUDOEPHEDRINE ER 5-120 MG PO TB12: 1.0000 | ORAL_TABLET | Freq: Two times a day (BID) | ORAL | Status: DC

## 2013-05-19 ENCOUNTER — Encounter: Payer: Self-pay | Admitting: *Deleted

## 2013-05-23 ENCOUNTER — Other Ambulatory Visit: Payer: Self-pay | Admitting: Family Medicine

## 2013-05-24 ENCOUNTER — Other Ambulatory Visit: Payer: Self-pay | Admitting: *Deleted

## 2013-05-24 MED ORDER — BISOPROLOL-HYDROCHLOROTHIAZIDE 10-6.25 MG PO TABS: ORAL_TABLET | ORAL | Status: DC

## 2013-05-24 MED ORDER — AMLODIPINE BESYLATE 10 MG PO TABS: 10.0000 mg | ORAL_TABLET | Freq: Every day | ORAL | Status: DC

## 2013-05-24 MED ORDER — LOSARTAN POTASSIUM 100 MG PO TABS: ORAL_TABLET | ORAL | Status: DC

## 2013-06-02 ENCOUNTER — Other Ambulatory Visit: Payer: Self-pay | Admitting: *Deleted

## 2013-06-02 ENCOUNTER — Other Ambulatory Visit: Payer: Self-pay | Admitting: Family Medicine

## 2013-06-02 MED ORDER — CETIRIZINE-PSEUDOEPHEDRINE ER 5-120 MG PO TB12: ORAL_TABLET | ORAL | Status: DC

## 2013-07-12 ENCOUNTER — Encounter: Payer: Self-pay | Admitting: *Deleted

## 2013-07-13 ENCOUNTER — Encounter: Payer: Self-pay | Admitting: Internal Medicine

## 2013-07-13 ENCOUNTER — Ambulatory Visit (INDEPENDENT_AMBULATORY_CARE_PROVIDER_SITE_OTHER): Payer: PRIVATE HEALTH INSURANCE | Admitting: Internal Medicine

## 2013-07-13 VITALS — BP 120/80 | HR 72 | Temp 97.8°F | Wt 140.0 lb

## 2013-07-13 DIAGNOSIS — J45909 Unspecified asthma, uncomplicated: Secondary | ICD-10-CM

## 2013-07-13 DIAGNOSIS — J4520 Mild intermittent asthma, uncomplicated: Secondary | ICD-10-CM

## 2013-07-13 DIAGNOSIS — E785 Hyperlipidemia, unspecified: Secondary | ICD-10-CM

## 2013-07-13 DIAGNOSIS — G2581 Restless legs syndrome: Secondary | ICD-10-CM

## 2013-07-13 DIAGNOSIS — I10 Essential (primary) hypertension: Secondary | ICD-10-CM

## 2013-07-13 LAB — HEPATIC FUNCTION PANEL
AST: 21 U/L (ref 0–37)
Albumin: 4.2 g/dL (ref 3.5–5.2)
Alkaline Phosphatase: 83 U/L (ref 39–117)
Bilirubin, Direct: 0 mg/dL (ref 0.0–0.3)
Total Protein: 7.2 g/dL (ref 6.0–8.3)

## 2013-07-13 LAB — CBC WITH DIFFERENTIAL/PLATELET
Eosinophils Relative: 3.7 % (ref 0.0–5.0)
HCT: 37 % (ref 36.0–46.0)
Hemoglobin: 12.8 g/dL (ref 12.0–15.0)
Lymphocytes Relative: 15 % (ref 12.0–46.0)
Lymphs Abs: 1.2 10*3/uL (ref 0.7–4.0)
Monocytes Relative: 7.7 % (ref 3.0–12.0)
Neutro Abs: 5.6 10*3/uL (ref 1.4–7.7)
RBC: 3.61 Mil/uL — ABNORMAL LOW (ref 3.87–5.11)
WBC: 7.7 10*3/uL (ref 4.5–10.5)

## 2013-07-13 LAB — LIPID PANEL
HDL: 63.8 mg/dL (ref >39.00)
Total CHOL/HDL Ratio: 4
VLDL: 20.4 mg/dL (ref 0.0–40.0)

## 2013-07-13 LAB — BASIC METABOLIC PANEL
GFR: 59.79 mL/min — ABNORMAL LOW (ref >60.00)
Potassium: 4.4 mEq/L (ref 3.5–5.1)
Sodium: 137 mEq/L (ref 135–145)

## 2013-07-13 MED ORDER — CLONAZEPAM 0.5 MG PO TABS: 0.2500 mg | ORAL_TABLET | Freq: Every day | ORAL | Status: DC | PRN

## 2013-07-13 NOTE — Progress Notes (Signed)
Subjective:    Patient ID: Brandy Hamilton, female    DOB: 28-May-1956, 57 y.o.   MRN: [id number]  HPI Here for check up Still sees Dr Welton Flakes for asthma Has been quiet Taken off advair as allergy shots have helped  Plans to stop with gyn--may go one year Care here after Last breast visit with Byrnett this year  Has mostly stopped smoking Only occ when out with friends 1 pack per month Counseled--recommended stopping completely  No chest pain or SOB No dizziness or syncope. May get lightheaded if she skips a meal Had gone to gym with trainer---now stopped this due to financial issues (massive repairs needed on rental properties) No edema  Still on fish oil for cholesterol No problems with this  Current Outpatient Prescriptions on File Prior to Visit  Medication Sig Dispense Refill  . amLODipine (NORVASC) 10 MG tablet Take 1 tablet (10 mg total) by mouth daily.  90 tablet  0  . bisoprolol-hydrochlorothiazide (ZIAC) 10-6.25 MG per tablet TAKE 1 TABLET BY MOUTH ONCE DAILY  90 tablet  0  . Calcium Carbonate (CALCIUM 500 PO) Take by mouth daily.        . cetirizine-pseudoephedrine (ZYRTEC-D) 5-120 MG per tablet TAKE ONE TABLET BY MOUTH TWICE DAILY  180 tablet  0  . clonazePAM (KLONOPIN) 0.5 MG tablet Take 0.5-1 tablets (0.25-0.5 mg total) by mouth daily as needed.  30 tablet  0  . fish oil-omega-3 fatty acids 1000 MG capsule Take 2 g by mouth daily.        Marland Kitchen levalbuterol (XOPENEX HFA) 45 MCG/ACT inhaler Inhale 1-2 puffs into the lungs every 6 (six) hours as needed.      Marland Kitchen losartan (COZAAR) 100 MG tablet TAKE 1 TABLET BY MOUTH ONCE DAILY  90 tablet  0  . montelukast (SINGULAIR) 5 MG chewable tablet Chew 5 mg by mouth as needed.        . Potassium 75 MG TABS Take by mouth daily.        Marland Kitchen triamcinolone (NASACORT) 55 MCG/ACT nasal inhaler Place 2 sprays into the nose as needed.         No current facility-administered medications on file prior to visit.    Allergies  Allergen Reactions   . Beclomethasone Dipropionate   . Levofloxacin   . Ramipril     REACTION: Cough    Past Medical History  Diagnosis Date  . Asthma   . Hyperlipidemia   . DCIS (ductal carcinoma in situ) of breast     partial mastectomy and RT-----------------Dr Byrnett  . Allergy     pollen, seasonal, perfumes, medications  . Cancer 11/07/2007    left partial mastectomy done 12/12/2007-DCIS ER/PR neg,positive margins identified to re-excisione done   . Hypertension     1998  . Personal history of tobacco use, presenting hazards to health   . Special screening for malignant neoplasms, colon     Past Surgical History  Procedure Laterality Date  . Breast biopsy      1998    Breast biopsy negative - Byrnett, 3/00     Left breast biopsy- stereotactic10/05   Left breast biopsy - (-) Byrnett11/08   Left breast biopsy--L partial mastectomy for DCIS, ,   . Colonoscopy  2009    hyperplastic polyp from rectum, Dr. Lemar Livings    Family History  Problem Relation Age of Onset  . Cancer Maternal Grandfather   . Cancer Other     breast cancer  History   Social History  . Marital Status: Divorced    Spouse Name: N/A    Number of Children: 0  . Years of Education: N/A   Occupational History  . 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 Rental properties    Social History Main Topics  . Smoking status: Current Some Day Smoker -- 0.25 packs/day for 5 years    Types: Cigarettes  . Smokeless tobacco: Never Used     Comment: discussed cold [place] stopping since not an everyday smoker  . Alcohol Use: Yes     Comment: drinks rarely  . Drug Use: No  . Sexually Active: Not on file   Other Topics Concern  . Not on file   Social History Narrative  . No narrative on file   Review of Systems Appetite is fine Weight stable Sleeps well    Objective:   Physical Exam  Constitutional: She appears well-developed and well-nourished. No distress.   Neck: Normal range of motion. Neck supple. No thyromegaly present.  Cardiovascular: Normal rate, regular rhythm and intact distal pulses.  Exam reveals no gallop.   Murmur heard. ?slight end systolic murmur along left sternal border  Pulmonary/Chest: Effort normal and breath sounds normal. No respiratory distress. She has no wheezes. She has no rales.  Abdominal: Soft. There is no tenderness.  Musculoskeletal: She exhibits no edema and no tenderness.  Lymphadenopathy:    She has no cervical adenopathy.  Psychiatric: She has a normal mood and affect. Her behavior is normal.          Assessment & Plan:

## 2013-07-13 NOTE — Assessment & Plan Note (Signed)
Better since immunotherapy Off advair now

## 2013-07-13 NOTE — Assessment & Plan Note (Signed)
BP Readings from Last 3 Encounters:  07/13/13 120/80  06/30/12 140/80  07/01/11 118/70   Good control No changes needed

## 2013-07-13 NOTE — Assessment & Plan Note (Signed)
On fish oil Discussed primary prevention-- no statin for now

## 2013-07-13 NOTE — Assessment & Plan Note (Addendum)
Does well with the ropinrole Uses clonazepam only if she goes on a cruise or something and has prolonged standing

## 2013-07-14 ENCOUNTER — Encounter: Payer: Self-pay | Admitting: *Deleted

## 2013-07-18 ENCOUNTER — Telehealth: Payer: Self-pay

## 2013-07-18 NOTE — Telephone Encounter (Signed)
Let her know it was fine I don't think we are notifying when there is no problem

## 2013-07-18 NOTE — Telephone Encounter (Signed)
Pt left v/m requesting drug test results done on 07/13/13.

## 2013-07-19 NOTE — Telephone Encounter (Signed)
Spoke with patient and advised results   

## 2013-07-24 ENCOUNTER — Encounter: Payer: Self-pay | Admitting: Internal Medicine

## 2013-10-17 ENCOUNTER — Ambulatory Visit: Payer: Self-pay | Admitting: General Surgery

## 2013-10-17 ENCOUNTER — Encounter: Payer: Self-pay | Admitting: General Surgery

## 2013-10-19 ENCOUNTER — Encounter: Payer: Self-pay | Admitting: General Surgery

## 2013-10-19 ENCOUNTER — Ambulatory Visit (INDEPENDENT_AMBULATORY_CARE_PROVIDER_SITE_OTHER): Payer: No Typology Code available for payment source | Admitting: General Surgery

## 2013-10-19 VITALS — BP 137/76 | HR 84 | Resp 14 | Ht 63.0 in | Wt 141.0 lb

## 2013-10-19 DIAGNOSIS — Z853 Personal history of malignant neoplasm of breast: Secondary | ICD-10-CM

## 2013-10-19 DIAGNOSIS — Z1239 Encounter for other screening for malignant neoplasm of breast: Secondary | ICD-10-CM

## 2013-10-19 NOTE — Progress Notes (Signed)
Patient ID: Brandy Hamilton, female   DOB: 02/26/1956, 57 y.o.   MRN: [id number]  Chief Complaint  Patient presents with  . Follow-up    1 year bilateral diagnostic mammogram. History of breast cancer    HPI Brandy Hamilton is a 57 y.o. female who presents for a breast evaluation. The most recent mammogram was done on 10/17/13 with a birad category 2. Patient does perform regular self breast checks and gets regular mammograms done. The patient denies any new problems with the breasts at this time.    HPI  Past Medical History  Diagnosis Date  . Asthma   . Hyperlipidemia   . DCIS (ductal carcinoma in situ) of breast     partial mastectomy and RT-----------------Dr Hara Milholland  . Allergy     pollen, seasonal, perfumes, medications  . Cancer 11/07/2007    left partial mastectomy done 12/12/2007-DCIS ER/PR neg,positive margins identified to re-excisione done   . Hypertension     1998  . Personal history of tobacco use, presenting hazards to health   . Special screening for malignant neoplasms, colon     Past Surgical History  Procedure Laterality Date  . Breast biopsy      1998    Breast biopsy negative - Brandy Hamilton, 3/00     Left breast biopsy- stereotactic10/05   Left breast biopsy - (-) Byrnett11/08   Left breast biopsy--L partial mastectomy for DCIS, ,   . Colonoscopy  2009    hyperplastic polyp from rectum, Dr. Lemar Livings    Family History  Problem Relation Age of Onset  . Cancer Maternal Grandfather   . Cancer Other     breast cancer    Social History History  Substance Use Topics  . Smoking status: Current Some Day Smoker -- 0.25 packs/day for 5 years    Types: Cigarettes  . Smokeless tobacco: Never Used     Comment: discussed cold [place] stopping since not an everyday smoker  . Alcohol Use: Yes     Comment: drinks rarely    Allergies  Allergen Reactions  . Beclomethasone Dipropionate Other (See Comments)    Patient unsure of reaction.  . Levofloxacin Other (See  Comments)    Patient unsure of reaction.   . Ramipril     REACTION: Cough    Current Outpatient Prescriptions  Medication Sig Dispense Refill  . amLODipine (NORVASC) 10 MG tablet Take 1 tablet (10 mg total) by mouth daily.  90 tablet  0  . bisoprolol-hydrochlorothiazide (ZIAC) 10-6.25 MG per tablet TAKE 1 TABLET BY MOUTH ONCE DAILY  90 tablet  0  . Calcium Carbonate (CALCIUM 500 PO) Take by mouth daily.        . cetirizine-pseudoephedrine (ZYRTEC-D) 5-120 MG per tablet TAKE ONE TABLET BY MOUTH TWICE DAILY  180 tablet  0  . clonazePAM (KLONOPIN) 0.5 MG tablet Take 0.5-1 tablets (0.25-0.5 mg total) by mouth daily as needed.  30 tablet  0  . fish oil-omega-3 fatty acids 1000 MG capsule Take 2 g by mouth daily.        Marland Kitchen levalbuterol (XOPENEX HFA) 45 MCG/ACT inhaler Inhale 1-2 puffs into the lungs every 6 (six) hours as needed.      Marland Kitchen losartan (COZAAR) 100 MG tablet TAKE 1 TABLET BY MOUTH ONCE DAILY  90 tablet  0  . montelukast (SINGULAIR) 5 MG chewable tablet Chew 5 mg by mouth as needed.        . Multiple Vitamins-Iron (STRESS FORMULA/IRON) TABS Take 1 tablet  by mouth daily.      . Potassium 75 MG TABS Take by mouth daily.        Marland Kitchen rOPINIRole (REQUIP) 0.5 MG tablet Take 0.5 mg by mouth at bedtime.      . triamcinolone (NASACORT) 55 MCG/ACT nasal inhaler Place 2 sprays into the nose as needed.         No current facility-administered medications for this visit.    Review of Systems Review of Systems  Constitutional: Negative.   Respiratory: Negative.   Cardiovascular: Negative.     Blood pressure 137/76, pulse 84, resp. rate 14, height 5\' 3"  (1.6 m), weight 141 lb (63.957 kg).  Physical Exam Physical Exam  Constitutional: She is oriented to person, place, and time. She appears well-developed and well-nourished.  Neck: No thyromegaly present.  Cardiovascular: Normal rate, regular rhythm and normal heart sounds.   No murmur heard. Pulmonary/Chest: Effort normal and breath sounds  normal. Right breast exhibits no inverted nipple, no mass, no nipple discharge, no skin change and no tenderness. Left breast exhibits no inverted nipple, no mass, no nipple discharge, no skin change and no tenderness.  Lymphadenopathy:    She has no cervical adenopathy.    She has no axillary adenopathy.  Neurological: She is alert and oriented to person, place, and time.  Skin: Skin is warm and dry.    Data Reviewed Bilateral diagnostic mammograms dated 10/17/2013 were reviewed. No interval change. BI-RAD-2.  Assessment    Benign breast exam, 6 years status post wide excision and whole breast radiation for extensive DCIS.    Plan    The patient's exam has remained stable since her initial surgical intervention in 2008. I think is reasonable for her to have annual mammograms arrange to her primary care physician. She is welcome to return at any time if she appreciates any changes in her breast or of abnormalities are identified on radiographic imaging.  As she did develop breast cancer before age 67, she is a candidate for BRCA testing. She will be contacted by phone to determine if she would be interested in investigating this test.       Brandy Hamilton 10/21/2013, 11:56 AM

## 2013-10-19 NOTE — Patient Instructions (Signed)
Patient to return as needed. Patient to call if any new questions or concerns.

## 2013-10-21 ENCOUNTER — Encounter: Payer: Self-pay | Admitting: General Surgery

## 2013-10-24 ENCOUNTER — Telehealth: Payer: Self-pay | Admitting: *Deleted

## 2013-10-24 NOTE — Telephone Encounter (Signed)
Notified patient as instructed. I talked with the patient about genetic testing in general. She states  "I just don't think I want to know".  I'm "not going to have stuff done because it is positive". Allowed her to discuss her thoughts regarding genetic testing and answered her questions regarding financial obligations. She is aware of the option and will let us know if in the future she changes her mind.

## 2013-10-24 NOTE — Telephone Encounter (Signed)
Message copied by Currie Paris on Tue Oct 24, 2013  8:33 AM ------      Message from: [name]      Created: Sat Oct 21, 2013 12:07 PM       Please notify the patient that she is a candidate for BRCA testing as her original cancer was diagnosed before age 57. If she would like to have this test completed, please arrange to have it done. Thank you ------

## 2013-10-26 ENCOUNTER — Encounter: Payer: Self-pay | Admitting: General Surgery

## 2013-10-27 ENCOUNTER — Encounter: Payer: Self-pay | Admitting: General Surgery

## 2014-02-12 ENCOUNTER — Other Ambulatory Visit: Payer: Self-pay | Admitting: Internal Medicine

## 2014-06-19 ENCOUNTER — Other Ambulatory Visit: Payer: Self-pay | Admitting: *Deleted

## 2014-06-19 MED ORDER — AMLODIPINE BESYLATE 10 MG PO TABS: ORAL_TABLET | ORAL | Status: DC

## 2014-06-19 MED ORDER — BISOPROLOL-HYDROCHLOROTHIAZIDE 10-6.25 MG PO TABS: ORAL_TABLET | ORAL | Status: DC

## 2014-06-19 MED ORDER — LOSARTAN POTASSIUM 100 MG PO TABS: ORAL_TABLET | ORAL | Status: DC

## 2014-06-19 MED ORDER — CETIRIZINE-PSEUDOEPHEDRINE ER 5-120 MG PO TB12: ORAL_TABLET | ORAL | Status: DC

## 2014-07-16 ENCOUNTER — Encounter: Payer: Self-pay | Admitting: Internal Medicine

## 2014-07-16 ENCOUNTER — Ambulatory Visit (INDEPENDENT_AMBULATORY_CARE_PROVIDER_SITE_OTHER): Payer: PRIVATE HEALTH INSURANCE | Admitting: Internal Medicine

## 2014-07-16 VITALS — BP 120/80 | HR 63 | Temp 98.6°F | Ht 63.0 in | Wt 146.0 lb

## 2014-07-16 DIAGNOSIS — G2581 Restless legs syndrome: Secondary | ICD-10-CM

## 2014-07-16 DIAGNOSIS — E785 Hyperlipidemia, unspecified: Secondary | ICD-10-CM

## 2014-07-16 DIAGNOSIS — J4520 Mild intermittent asthma, uncomplicated: Secondary | ICD-10-CM

## 2014-07-16 DIAGNOSIS — I10 Essential (primary) hypertension: Secondary | ICD-10-CM

## 2014-07-16 DIAGNOSIS — Z0000 Encounter for general adult medical examination without abnormal findings: Secondary | ICD-10-CM

## 2014-07-16 DIAGNOSIS — Z853 Personal history of malignant neoplasm of breast: Secondary | ICD-10-CM

## 2014-07-16 DIAGNOSIS — J45909 Unspecified asthma, uncomplicated: Secondary | ICD-10-CM

## 2014-07-16 LAB — CBC WITH DIFFERENTIAL/PLATELET
BASOS ABS: 0.1 10*3/uL (ref 0.0–0.1)
Basophils Relative: 1 % (ref 0–1)
Eosinophils Absolute: 0.2 10*3/uL (ref 0.0–0.7)
Eosinophils Relative: 3 % (ref 0–5)
HEMATOCRIT: 39.5 % (ref 36.0–46.0)
HEMOGLOBIN: 13.6 g/dL (ref 12.0–15.0)
LYMPHS ABS: 1.6 10*3/uL (ref 0.7–4.0)
LYMPHS PCT: 21 % (ref 12–46)
MCH: 33 pg (ref 26.0–34.0)
MCHC: 34.4 g/dL (ref 30.0–36.0)
MCV: 95.9 fL (ref 78.0–100.0)
MONO ABS: 0.4 10*3/uL (ref 0.1–1.0)
Monocytes Relative: 5 % (ref 3–12)
NEUTROS ABS: 5.3 10*3/uL (ref 1.7–7.7)
Neutrophils Relative %: 70 % (ref 43–77)
Platelets: 274 10*3/uL (ref 150–400)
RBC: 4.12 MIL/uL (ref 3.87–5.11)
RDW: 12.3 % (ref 11.5–15.5)
WBC: 7.5 10*3/uL (ref 4.0–10.5)

## 2014-07-16 NOTE — Assessment & Plan Note (Signed)
Will change requip to bedtime

## 2014-07-16 NOTE — Assessment & Plan Note (Signed)
BP Readings from Last 3 Encounters:  07/16/14 120/80  10/19/13 137/76  07/13/13 120/80   Good control No changes

## 2014-07-16 NOTE — Assessment & Plan Note (Signed)
Diagnostic mammo due in October

## 2014-07-16 NOTE — Assessment & Plan Note (Signed)
UTD with preventative care Will be careful with eating again---gained weight after mom's death

## 2014-07-16 NOTE — Progress Notes (Signed)
Pre visit review using our clinic review tool, if applicable. No additional management support is needed unless otherwise documented below in the visit note. 

## 2014-07-16 NOTE — Patient Instructions (Signed)
Your mammogram is due in October  DASH Eating Plan DASH stands for "Dietary Approaches to Stop Hypertension." The DASH eating plan is a healthy eating plan that has been shown to reduce high blood pressure (hypertension). Additional health benefits may include reducing the risk of type 2 diabetes mellitus, heart disease, and stroke. The DASH eating plan may also help with weight loss. WHAT DO I NEED TO KNOW ABOUT THE DASH EATING PLAN? For the DASH eating plan, you will follow these general guidelines:  Choose foods with a percent daily value for sodium of less than 5% (as listed on the food label).  Use salt-free seasonings or herbs instead of table salt or sea salt.  Check with your health care provider or pharmacist before using salt substitutes.  Eat lower-sodium products, often labeled as "lower sodium" or "no salt added."  Eat fresh foods.  Eat more vegetables, fruits, and low-fat dairy products.  Choose whole grains. Look for the word "whole" as the first word in the ingredient list.  Choose fish and skinless chicken or [place] more often than red meat. Limit fish, poultry, and meat to 6 oz (170 g) each day.  Limit sweets, desserts, sugars, and sugary drinks.  Choose heart-healthy fats.  Limit cheese to 1 oz (28 g) per day.  Eat more home-cooked food and less restaurant, buffet, and fast food.  Limit fried foods.  Cook foods using methods other than frying.  Limit canned vegetables. If you do use them, rinse them well to decrease the sodium.  When eating at a restaurant, ask that your food be prepared with less salt, or no salt if possible. WHAT FOODS CAN I EAT? Seek help from a dietitian for individual calorie needs. Grains Whole grain or whole wheat bread. Brown rice. Whole grain or whole wheat pasta. Quinoa, bulgur, and whole grain cereals. Low-sodium cereals. Corn or whole wheat flour tortillas. Whole grain cornbread. Whole grain crackers. Low-sodium  crackers. Vegetables Fresh or frozen vegetables (raw, steamed, roasted, or grilled). Low-sodium or reduced-sodium tomato and vegetable juices. Low-sodium or reduced-sodium tomato sauce and paste. Low-sodium or reduced-sodium canned vegetables.  Fruits All fresh, canned (in natural juice), or frozen fruits. Meat and Other Protein Products Ground beef (85% or leaner), grass-fed beef, or beef trimmed of fat. Skinless chicken or [place]. Ground chicken or [place]. Pork trimmed of fat. All fish and seafood. Eggs. Dried beans, peas, or lentils. Unsalted nuts and seeds. Unsalted canned beans. Dairy Low-fat dairy products, such as skim or 1% milk, 2% or reduced-fat cheeses, low-fat ricotta or cottage cheese, or plain low-fat yogurt. Low-sodium or reduced-sodium cheeses. Fats and Oils Tub margarines without trans fats. Light or reduced-fat mayonnaise and salad dressings (reduced sodium). Avocado. Safflower, olive, or canola oils. Natural peanut or almond butter. Other Unsalted popcorn and pretzels. The items listed above may not be a complete list of recommended foods or beverages. Contact your dietitian for more options. WHAT FOODS ARE NOT RECOMMENDED? Grains White bread. White pasta. White rice. Refined cornbread. Bagels and croissants. Crackers that contain trans fat. Vegetables Creamed or fried vegetables. Vegetables in a cheese sauce. Regular canned vegetables. Regular canned tomato sauce and paste. Regular tomato and vegetable juices. Fruits Dried fruits. Canned fruit in light or heavy syrup. Fruit juice. Meat and Other Protein Products Fatty cuts of meat. Ribs, chicken wings, bacon, sausage, bologna, salami, chitterlings, fatback, hot dogs, bratwurst, and packaged luncheon meats. Salted nuts and seeds. Canned beans with salt. Dairy Whole or 2% milk, cream, half-and-half, and  cream cheese. Whole-fat or sweetened yogurt. Full-fat cheeses or blue cheese. Nondairy creamers and whipped toppings.  Processed cheese, cheese spreads, or cheese curds. Condiments Onion and garlic salt, seasoned salt, table salt, and sea salt. Canned and packaged gravies. Worcestershire sauce. Tartar sauce. Barbecue sauce. Teriyaki sauce. Soy sauce, including reduced sodium. Steak sauce. Fish sauce. Oyster sauce. Cocktail sauce. Horseradish. Ketchup and mustard. Meat flavorings and tenderizers. Bouillon cubes. Hot sauce. Tabasco sauce. Marinades. Taco seasonings. Relishes. Fats and Oils Butter, stick margarine, lard, shortening, ghee, and bacon fat. Coconut, palm kernel, or palm oils. Regular salad dressings. Other Pickles and olives. Salted popcorn and pretzels. The items listed above may not be a complete list of foods and beverages to avoid. Contact your dietitian for more information. WHERE CAN I FIND MORE INFORMATION? National Heart, Lung, and [employer] Institute: www.nhlbi.nih.gov/health/health-topics/topics/dash/ Document Released: 11/26/2011 Document Revised: 04/23/2014 Document Reviewed: 10/11/2013 [employer] Patient Information 2015 [employer]. This information is not intended to replace advice given to you by your health care provider. Make sure you discuss any questions you have with your health care provider.  

## 2014-07-16 NOTE — Assessment & Plan Note (Signed)
Discussed primary prevention She doesn't want statin

## 2014-07-16 NOTE — Assessment & Plan Note (Signed)
Doing well off advair Sees Dr Humphrey Rolls

## 2014-07-16 NOTE — Progress Notes (Signed)
Subjective:    Patient ID: Brandy Hamilton, female    DOB: 1956-07-03, 58 y.o.   MRN: [id number]  HPI Here for physical Having lots of stress since mom died unexpectantly Training puppy --has not been easy   Released by Dr Bary Castilla  Needs breast exam here now Done with gyn Last Pap in 2013--- normal then.  Last visit to Dr Humphrey Rolls-- had spirometry which was okay Since then, got bronchitis and was treated Off advair now On nasacort on singulair was on allergy immunotherapy but now off since January  Current Outpatient Prescriptions on File Prior to Visit  Medication Sig Dispense Refill  . amLODipine (NORVASC) 10 MG tablet TAKE 1 TABLET BY MOUTH ONCE DAILY  90 tablet  1  . bisoprolol-hydrochlorothiazide (ZIAC) 10-6.25 MG per tablet TAKE 1 TABLET BY MOUTH ONCE DAILY  90 tablet  1  . Calcium Carbonate (CALCIUM 500 PO) Take by mouth daily.        . cetirizine-pseudoephedrine (ZYRTEC-D) 5-120 MG per tablet TAKE ONE TABLET BY MOUTH TWICE DAILY  180 tablet  1  . clonazePAM (KLONOPIN) 0.5 MG tablet Take 0.5-1 tablets (0.25-0.5 mg total) by mouth daily as needed.  30 tablet  0  . fish oil-omega-3 fatty acids 1000 MG capsule Take 2 g by mouth daily.        Marland Kitchen levalbuterol (XOPENEX HFA) 45 MCG/ACT inhaler Inhale 1-2 puffs into the lungs every 6 (six) hours as needed.      Marland Kitchen losartan (COZAAR) 100 MG tablet TAKE 1 TABLET BY MOUTH ONCE DAILY  90 tablet  1  . montelukast (SINGULAIR) 5 MG chewable tablet Chew 5 mg by mouth as needed.        . Multiple Vitamins-Iron (STRESS FORMULA/IRON) TABS Take 1 tablet by mouth daily.      . Potassium 75 MG TABS Take by mouth daily.        Marland Kitchen rOPINIRole (REQUIP) 0.5 MG tablet Take 0.5 mg by mouth at bedtime.      . triamcinolone (NASACORT) 55 MCG/ACT nasal inhaler Place 2 sprays into the nose as needed.         No current facility-administered medications on file prior to visit.    Allergies  Allergen Reactions  . Beclomethasone Dipropionate Other (See  Comments)    Patient unsure of reaction.  . Levofloxacin Other (See Comments)    Patient unsure of reaction.   . Ramipril     REACTION: Cough    Past Medical History  Diagnosis Date  . Asthma   . Hyperlipidemia   . DCIS (ductal carcinoma in situ) of breast     partial mastectomy and RT-----------------Dr Byrnett  . Allergy     pollen, seasonal, perfumes, medications  . Cancer 11/07/2007    left partial mastectomy done 12/12/2007-DCIS ER/PR neg,positive margins identified to re-excisione done   . Hypertension     1998  . Personal history of tobacco use, presenting hazards to health   . Special screening for malignant neoplasms, colon     Past Surgical History  Procedure Laterality Date  . Colonoscopy  2009    hyperplastic polyp from rectum, Dr. Bary Castilla  . Breast biopsy      1998    Breast biopsy negative - Byrnett, 3/00     Left breast biopsy- stereotactic10/05   Left breast biopsy - (-) Byrnett11/08   Left breast biopsy--L partial mastectomy for DCIS, ,   . Breast surgery Left 11/07/2007    3 cm area  of DCIS, resected the negative margins. PR: 0%; ER less than 5%. Treated by whole breast radiation, patient declined antiestrogen therapy.    Family History  Problem Relation Age of Onset  . Cancer Maternal Grandfather   . Cancer Other     breast cancer  . Heart disease Mother   . Hypertension Mother     History   Social History  . Marital Status: Divorced    Spouse Name: N/A    Number of Children: 0  . Years of Education: N/A   Occupational History  . [place]     retired  . Bookkeeping, taxes---part time        . Rental properties   .      Social History Main Topics  . Smoking status: Current Some Day Smoker -- 0.25 packs/day for 5 years    Types: Cigarettes  . Smokeless tobacco: Never Used     Comment: discussed cold [place] stopping since not an everyday smoker  . Alcohol Use: Yes     Comment: drinks rarely  . Drug Use: No  .  Sexual Activity: Not on file   Other Topics Concern  . Not on file   Social History Narrative  . No narrative on file   Review of Systems  Constitutional: Positive for unexpected weight change. Negative for fatigue.       Weight up 6# since last year Now wearing seat belt Rarely smokes--once a week if out with friends  HENT: Negative for dental problem, hearing loss and tinnitus.        Regular with dentist  Eyes: Negative for visual disturbance.       No diplopia or unilateral vision loss  Respiratory: Positive for cough. Negative for chest tightness and shortness of breath.        Only uses xopenex prn--- down to daily with recent bronchitis (but hadn't needed it in a year or so)  Cardiovascular: Negative for chest pain, palpitations and leg swelling.  Gastrointestinal: Negative for nausea, vomiting, abdominal pain, constipation and blood in stool.       Rare heartburn  Endocrine: Negative for cold intolerance and heat intolerance.  Genitourinary: Negative for dysuria, hematuria and difficulty urinating.       No incontinence  Musculoskeletal: Negative for arthralgias, back pain and joint swelling.  Skin: Positive for rash.       Facial rash if she eats tomatoes or oranges No suspicious lesions--slight bump on right cheek she wants checked  Allergic/Immunologic: Positive for environmental allergies. Negative for immunocompromised state.  Neurological: Positive for headaches. Negative for dizziness, syncope, weakness and light-headedness.       Rare headaches   Psychiatric/Behavioral: Positive for sleep disturbance. Negative for dysphoric mood. The patient is not nervous/anxious.        Has regular restless legs syndrome--some during day and she has been taking the requip in day. Will change to bedtime       Objective:   Physical Exam  Constitutional: She is oriented to person, place, and time. She appears well-developed and well-nourished. No distress.  HENT:  Head:  Normocephalic and atraumatic.  Right Ear: External ear normal.  Left Ear: External ear normal.  Mouth/Throat: Oropharynx is clear and moist. No oropharyngeal exudate.  Eyes: Conjunctivae and EOM are normal. Pupils are equal, round, and reactive to light.  Neck: Normal range of motion. Neck supple. No thyromegaly present.  Cardiovascular: Normal rate, regular rhythm, normal heart sounds and intact distal  pulses.  Exam reveals no gallop.   No murmur heard. Pulmonary/Chest: Effort normal and breath sounds normal. No respiratory distress. She has no wheezes. She has no rales.  Abdominal: Soft. There is no tenderness.  Genitourinary:  Thickening along scar left breast 3 o'clock line  Musculoskeletal: She exhibits no edema and no tenderness.  Lymphadenopathy:    She has no cervical adenopathy.    She has no axillary adenopathy.  Neurological: She is alert and oriented to person, place, and time.  Skin: No rash noted. No erythema.  Psychiatric: She has a normal mood and affect. Her behavior is normal.          Assessment & Plan:

## 2014-07-16 NOTE — Addendum Note (Signed)
Addended by: Ellamae Sia on: 07/16/2014 05:06 PM   Modules accepted: Orders

## 2014-07-17 ENCOUNTER — Encounter: Payer: Self-pay | Admitting: Family Medicine

## 2014-07-17 LAB — COMPREHENSIVE METABOLIC PANEL
ALT: 26 U/L (ref 0–35)
AST: 23 U/L (ref 0–37)
Albumin: 4.3 g/dL (ref 3.5–5.2)
Alkaline Phosphatase: 83 U/L (ref 39–117)
BUN: 22 mg/dL (ref 6–23)
CO2: 25 meq/L (ref 19–32)
CREATININE: 0.71 mg/dL (ref 0.50–1.10)
Calcium: 10.2 mg/dL (ref 8.4–10.5)
Chloride: 105 mEq/L (ref 96–112)
Glucose, Bld: 88 mg/dL (ref 70–99)
Potassium: 4.1 mEq/L (ref 3.5–5.3)
Sodium: 141 mEq/L (ref 135–145)
Total Bilirubin: 0.4 mg/dL (ref 0.2–1.2)
Total Protein: 6.9 g/dL (ref 6.0–8.3)

## 2014-07-17 LAB — LIPID PANEL
Cholesterol: 243 mg/dL — ABNORMAL HIGH (ref 0–200)
HDL: 54 mg/dL (ref >39)
LDL CALC: 150 mg/dL — AB (ref 0–99)
TRIGLYCERIDES: 195 mg/dL — AB (ref <150)
Total CHOL/HDL Ratio: 4.5 Ratio
VLDL: 39 mg/dL (ref 0–40)

## 2014-07-17 LAB — T4, FREE: Free T4: 1.22 ng/dL (ref 0.80–1.80)

## 2014-09-14 ENCOUNTER — Telehealth: Payer: Self-pay | Admitting: Internal Medicine

## 2014-09-14 DIAGNOSIS — Z853 Personal history of malignant neoplasm of breast: Secondary | ICD-10-CM

## 2014-09-14 NOTE — Telephone Encounter (Signed)
Patient called to get you to order her Bilateral Diagnostic MMG for history of breast cancer. Dr Bary Castilla has turned her loose so she needs you to please put the order in. I have her set up for 10/18/14 at 10am.

## 2014-09-18 NOTE — Telephone Encounter (Signed)
Order faxed to [facility] and patient notified.

## 2014-09-27 ENCOUNTER — Telehealth: Payer: Self-pay | Admitting: Internal Medicine

## 2014-09-27 DIAGNOSIS — Z853 Personal history of malignant neoplasm of breast: Secondary | ICD-10-CM

## 2014-09-27 NOTE — Telephone Encounter (Signed)
[facility] called and is requesting an order for R and L Breast ultrasound for history of breast cancer Z85.3.  Will Fax to [phone] / lt

## 2014-09-28 ENCOUNTER — Ambulatory Visit: Payer: Self-pay | Admitting: Internal Medicine

## 2014-10-01 ENCOUNTER — Encounter: Payer: Self-pay | Admitting: Internal Medicine

## 2014-10-22 ENCOUNTER — Encounter: Payer: Self-pay | Admitting: Internal Medicine

## 2014-12-10 ENCOUNTER — Telehealth: Payer: Self-pay

## 2014-12-10 NOTE — Telephone Encounter (Signed)
Pt left v/m requesting status of refills sent from pharmacy; did pharmacy fax refill request; pt did not specify names of meds and pt also got email that there is something in [facility] account that needed attention; pt wants to know what is in her [facility] file that needs attention. Pt request cb.

## 2014-12-11 ENCOUNTER — Other Ambulatory Visit: Payer: Self-pay | Admitting: *Deleted

## 2014-12-11 MED ORDER — BISOPROLOL-HYDROCHLOROTHIAZIDE 10-6.25 MG PO TABS: ORAL_TABLET | ORAL | Status: DC

## 2014-12-11 MED ORDER — CETIRIZINE-PSEUDOEPHEDRINE ER 5-120 MG PO TB12: ORAL_TABLET | ORAL | Status: DC

## 2014-12-11 MED ORDER — AMLODIPINE BESYLATE 10 MG PO TABS: ORAL_TABLET | ORAL | Status: DC

## 2014-12-11 MED ORDER — LOSARTAN POTASSIUM 100 MG PO TABS: ORAL_TABLET | ORAL | Status: DC

## 2014-12-11 NOTE — Telephone Encounter (Signed)
rx sent to pharmacy by e-script  

## 2015-04-01 ENCOUNTER — Other Ambulatory Visit: Payer: Self-pay

## 2015-04-01 MED ORDER — BISOPROLOL-HYDROCHLOROTHIAZIDE 10-6.25 MG PO TABS: ORAL_TABLET | ORAL | Status: DC

## 2015-04-01 MED ORDER — AMLODIPINE BESYLATE 10 MG PO TABS: ORAL_TABLET | ORAL | Status: DC

## 2015-04-01 MED ORDER — CETIRIZINE-PSEUDOEPHEDRINE ER 5-120 MG PO TB12: ORAL_TABLET | ORAL | Status: DC

## 2015-04-01 MED ORDER — LOSARTAN POTASSIUM 100 MG PO TABS: ORAL_TABLET | ORAL | Status: DC

## 2015-04-01 NOTE — Telephone Encounter (Signed)
Pt has changed ins and request amlodipine, ziac, zyrtec D and losartan to new mail order pharmacy [facility]. Advised pt done. Pt has appt 07/22/15.

## 2015-07-22 ENCOUNTER — Encounter: Payer: Self-pay | Admitting: Internal Medicine

## 2015-07-22 ENCOUNTER — Ambulatory Visit (INDEPENDENT_AMBULATORY_CARE_PROVIDER_SITE_OTHER): Payer: PRIVATE HEALTH INSURANCE | Admitting: Internal Medicine

## 2015-07-22 ENCOUNTER — Other Ambulatory Visit (HOSPITAL_COMMUNITY)
Admission: RE | Admit: 2015-07-22 | Discharge: 2015-07-22 | Disposition: A | Discharge status: Home / Self Care | Payer: PRIVATE HEALTH INSURANCE | Source: Ambulatory Visit | Attending: Internal Medicine | Admitting: Internal Medicine

## 2015-07-22 VITALS — BP 118/70 | HR 64 | Temp 98.3°F | Ht 63.0 in | Wt 149.0 lb

## 2015-07-22 DIAGNOSIS — Z1151 Encounter for screening for human papillomavirus (HPV): Secondary | ICD-10-CM | POA: Diagnosis present

## 2015-07-22 DIAGNOSIS — G2581 Restless legs syndrome: Secondary | ICD-10-CM

## 2015-07-22 DIAGNOSIS — I10 Essential (primary) hypertension: Secondary | ICD-10-CM

## 2015-07-22 DIAGNOSIS — E785 Hyperlipidemia, unspecified: Secondary | ICD-10-CM

## 2015-07-22 DIAGNOSIS — Z0000 Encounter for general adult medical examination without abnormal findings: Secondary | ICD-10-CM | POA: Diagnosis not present

## 2015-07-22 DIAGNOSIS — Z01419 Encounter for gynecological examination (general) (routine) without abnormal findings: Secondary | ICD-10-CM | POA: Insufficient documentation

## 2015-07-22 DIAGNOSIS — J4520 Mild intermittent asthma, uncomplicated: Secondary | ICD-10-CM

## 2015-07-22 MED ORDER — ROPINIROLE HCL 0.5 MG PO TABS: 0.5000 mg | ORAL_TABLET | Freq: Every day | ORAL | Status: DC

## 2015-07-22 MED ORDER — CETIRIZINE-PSEUDOEPHEDRINE ER 5-120 MG PO TB12: ORAL_TABLET | ORAL | Status: DC

## 2015-07-22 NOTE — Assessment & Plan Note (Signed)
BP Readings from Last 3 Encounters:  07/22/15 118/70  07/16/14 120/80  10/19/13 137/76   Good control No change needed

## 2015-07-22 NOTE — Progress Notes (Signed)
Subjective:    Patient ID: Brandy Hamilton, female    DOB: 08/23/1956, 59 y.o.   MRN: [id number]  HPI Here for physical  Doing well Stopped clonazepam Using ropinrole for night--working well for her RLS  Still gets SOB at times Doing much better since she got allergy shots On montelukast Hasn't needed xopenex Still mostly responds to perfume   Dr Bary Castilla has released her No longer seeing gyn Last pap ~ 3 years ago  Smokes very little now Discussed stopping the rest of the way  Current Outpatient Prescriptions on File Prior to Visit  Medication Sig Dispense Refill  . amLODipine (NORVASC) 10 MG tablet TAKE 1 TABLET BY MOUTH ONCE DAILY 90 tablet 1  . bisoprolol-hydrochlorothiazide (ZIAC) 10-6.25 MG per tablet TAKE 1 TABLET BY MOUTH ONCE DAILY 90 tablet 1  . Calcium Carbonate (CALCIUM 500 PO) Take by mouth daily.      . cetirizine-pseudoephedrine (ZYRTEC-D) 5-120 MG per tablet TAKE ONE TABLET BY MOUTH TWICE DAILY 180 tablet 1  . fish oil-omega-3 fatty acids 1000 MG capsule Take 2 g by mouth daily.      Marland Kitchen levalbuterol (XOPENEX HFA) 45 MCG/ACT inhaler Inhale 1-2 puffs into the lungs every 6 (six) hours as needed.    Marland Kitchen losartan (COZAAR) 100 MG tablet TAKE 1 TABLET BY MOUTH ONCE DAILY 90 tablet 1  . montelukast (SINGULAIR) 5 MG chewable tablet Chew 5 mg by mouth as needed.      . Multiple Vitamins-Iron (STRESS FORMULA/IRON) TABS Take 1 tablet by mouth daily.    . Potassium 75 MG TABS Take by mouth daily.      Marland Kitchen rOPINIRole (REQUIP) 0.5 MG tablet Take 0.5 mg by mouth at bedtime.    . triamcinolone (NASACORT) 55 MCG/ACT nasal inhaler Place 2 sprays into the nose as needed.       No current facility-administered medications on file prior to visit.    Allergies  Allergen Reactions  . Beclomethasone Dipropionate Other (See Comments)    Patient unsure of reaction.  . Levofloxacin Other (See Comments)    Patient unsure of reaction.   . Ramipril     REACTION: Cough    Past Medical  History  Diagnosis Date  . Asthma   . Hyperlipidemia   . DCIS (ductal carcinoma in situ) of breast     partial mastectomy and RT-----------------Dr Byrnett  . Allergy     pollen, seasonal, perfumes, medications  . Cancer 11/07/2007    left partial mastectomy done 12/12/2007-DCIS ER/PR neg,positive margins identified to re-excisione done   . Hypertension     1998  . Personal history of tobacco use, presenting hazards to health   . Special screening for malignant neoplasms, colon     Past Surgical History  Procedure Laterality Date  . Colonoscopy  2009    hyperplastic polyp from rectum, Dr. Bary Castilla  . Breast biopsy      1998    Breast biopsy negative - Byrnett, 3/00     Left breast biopsy- stereotactic10/05   Left breast biopsy - (-) Byrnett11/08   Left breast biopsy--L partial mastectomy for DCIS, ,   . Breast surgery Left 11/07/2007    3 cm area of DCIS, resected the negative margins. PR: 0%; ER less than 5%. Treated by whole breast radiation, patient declined antiestrogen therapy.    Family History  Problem Relation Age of Onset  . Cancer Maternal Grandfather   . Cancer Other     breast cancer  .  Heart disease Mother   . Hypertension Mother     History   Social History  . Marital Status: Divorced    Spouse Name: N/A  . Number of Children: 0  . Years of Education: N/A   Occupational History  . [place]     retired  . Bookkeeping, taxes---part time        . Rental properties   .      Social History Main Topics  . Smoking status: Current Some Day Smoker -- 0.25 packs/day for 5 years    Types: Cigarettes  . Smokeless tobacco: Never Used     Comment: discussed cold [place] stopping since not an everyday smoker  . Alcohol Use: Yes     Comment: drinks rarely  . Drug Use: No  . Sexual Activity: Not on file   Other Topics Concern  . Not on file   Social History Narrative   Review of Systems  Constitutional: Negative for fatigue and  unexpected weight change.       Wears seat belt mostly  HENT: Negative for dental problem, hearing loss and tinnitus.        Keeps up with dentist  Eyes: Negative for visual disturbance.       No diplopia or unilateral vision loss  Respiratory: Positive for cough and shortness of breath. Negative for chest tightness.   Cardiovascular: Positive for leg swelling. Negative for chest pain and palpitations.       Rare leg swelling  Gastrointestinal:       Rare red blood in bowl--if she strains Rare heartburn--tums helps  Genitourinary: Negative for dysuria, difficulty urinating and dyspareunia.  Musculoskeletal: Positive for back pain. Negative for joint swelling and arthralgias.  Skin: Negative for rash.       Has spot on breast--she scratched. Now healing  Allergic/Immunologic: Positive for environmental allergies. Negative for immunocompromised state.  Neurological: Negative for dizziness, syncope, weakness, light-headedness and headaches.  Psychiatric/Behavioral: Positive for sleep disturbance. Negative for dysphoric mood. The patient is not nervous/anxious.        Objective:   Physical Exam  Constitutional: She is oriented to person, place, and time. She appears well-developed and well-nourished. No distress.  HENT:  Head: Normocephalic and atraumatic.  Right Ear: External ear normal.  Left Ear: External ear normal.  Mouth/Throat: Oropharynx is clear and moist. No oropharyngeal exudate.  Eyes: Conjunctivae and EOM are normal. Pupils are equal, round, and reactive to light.  Neck: Normal range of motion. Neck supple. No thyromegaly present.  Cardiovascular: Normal rate, regular rhythm, normal heart sounds and intact distal pulses.  Exam reveals no gallop.   No murmur heard. Pulmonary/Chest: Effort normal and breath sounds normal. No respiratory distress. She has no wheezes. She has no rales.  Abdominal: Soft. There is no tenderness.  Musculoskeletal: She exhibits no edema or  tenderness.  Lymphadenopathy:    She has no cervical adenopathy.  Neurological: She is alert and oriented to person, place, and time.  Skin: No rash noted. No erythema.  Psychiatric: She has a normal mood and affect. Her behavior is normal.          Assessment & Plan:

## 2015-07-22 NOTE — Assessment & Plan Note (Signed)
Still prefers no primary prevention with statin

## 2015-07-22 NOTE — Progress Notes (Signed)
Pre visit review using our clinic review tool, if applicable. No additional management support is needed unless otherwise documented below in the visit note. 

## 2015-07-22 NOTE — Assessment & Plan Note (Signed)
Fairly healthy Colonoscopy due 2019 Pap today Yearly screening mammogram due to breast cancer history

## 2015-07-22 NOTE — Assessment & Plan Note (Signed)
Doing well with just the ropinrole

## 2015-07-22 NOTE — Assessment & Plan Note (Signed)
Good with montelukast and zyrtec D

## 2015-07-22 NOTE — Addendum Note (Signed)
Addended by: Despina Hidden on: 07/22/2015 04:49 PM   Modules accepted: Orders

## 2015-07-23 LAB — COMPREHENSIVE METABOLIC PANEL
ALT: 20 U/L (ref 0–35)
AST: 20 U/L (ref 0–37)
Albumin: 4.3 g/dL (ref 3.5–5.2)
Alkaline Phosphatase: 94 U/L (ref 39–117)
BILIRUBIN TOTAL: 0.4 mg/dL (ref 0.2–1.2)
BUN: 20 mg/dL (ref 6–23)
CALCIUM: 10.2 mg/dL (ref 8.4–10.5)
CHLORIDE: 105 meq/L (ref 96–112)
CO2: 29 meq/L (ref 19–32)
Creatinine, Ser: 0.91 mg/dL (ref 0.40–1.20)
GFR: 67.71 mL/min (ref >60.00)
Glucose, Bld: 95 mg/dL (ref 70–99)
Potassium: 4 mEq/L (ref 3.5–5.1)
Sodium: 141 mEq/L (ref 135–145)
TOTAL PROTEIN: 7.2 g/dL (ref 6.0–8.3)

## 2015-07-23 LAB — CBC WITH DIFFERENTIAL/PLATELET
BASOS ABS: 0.2 10*3/uL — AB (ref 0.0–0.1)
BASOS PCT: 2.3 % (ref 0.0–3.0)
EOS ABS: 0.3 10*3/uL (ref 0.0–0.7)
Eosinophils Relative: 3.2 % (ref 0.0–5.0)
HCT: 40.8 % (ref 36.0–46.0)
HEMOGLOBIN: 14.1 g/dL (ref 12.0–15.0)
LYMPHS ABS: 1.9 10*3/uL (ref 0.7–4.0)
Lymphocytes Relative: 19.1 % (ref 12.0–46.0)
MCHC: 34.6 g/dL (ref 30.0–36.0)
MCV: 98.2 fl (ref 78.0–100.0)
MONO ABS: 0.4 10*3/uL (ref 0.1–1.0)
Monocytes Relative: 3.9 % (ref 3.0–12.0)
NEUTROS ABS: 7.2 10*3/uL (ref 1.4–7.7)
Neutrophils Relative %: 71.5 % (ref 43.0–77.0)
Platelets: 320 10*3/uL (ref 150.0–400.0)
RBC: 4.16 Mil/uL (ref 3.87–5.11)
RDW: 12.4 % (ref 11.5–15.5)
WBC: 10 10*3/uL (ref 4.0–10.5)

## 2015-07-23 LAB — LIPID PANEL
CHOL/HDL RATIO: 5
Cholesterol: 234 mg/dL — ABNORMAL HIGH (ref 0–200)
HDL: 50.8 mg/dL (ref >39.00)
NONHDL: 182.97
Triglycerides: 351 mg/dL — ABNORMAL HIGH (ref 0.0–149.0)
VLDL: 70.2 mg/dL — AB (ref 0.0–40.0)

## 2015-07-23 LAB — T4, FREE: Free T4: 1.04 ng/dL (ref 0.60–1.60)

## 2015-07-23 LAB — LDL CHOLESTEROL, DIRECT: Direct LDL: 142 mg/dL

## 2015-07-23 NOTE — Progress Notes (Signed)
   Subjective:    Patient ID: Brandy Hamilton, female    DOB: 1956-05-30, 59 y.o.   MRN: [id number]  HPI    Review of Systems     Objective:   Physical Exam  Genitourinary:  Some thickening along left breast lateral scar. No worrisome masses in either breast. Superficial ulcer from scratching in lower right breast--no underlying abnormality  Normal introitus and cervix--pap done Bimanual negative          Assessment & Plan:

## 2015-07-24 LAB — CYTOLOGY - PAP

## 2015-08-31 ENCOUNTER — Other Ambulatory Visit: Payer: Self-pay | Admitting: Internal Medicine

## 2015-09-06 ENCOUNTER — Telehealth: Payer: Self-pay

## 2015-09-06 NOTE — Telephone Encounter (Signed)
Pt left v/m requesting prior auth for zyrtec D; dx asthma. Pt request cb. Prior auth # [id number]. Pt request cb.

## 2015-09-09 NOTE — Telephone Encounter (Signed)
Tried explaining to pt that this may not be covered because it's OTC and pt wanted to try and get the prior auth if possible.   Sent thru [facility]

## 2015-09-09 NOTE — Telephone Encounter (Signed)
Can this pt not buy this over the counter? Or should I try for the pre-auth?

## 2015-09-09 NOTE — Telephone Encounter (Signed)
It is OTC I don't think they will pay for it. This can be pricey at times--but the plain generic zyrtec is 3-4 cents at [facility] and [facility] in the big bottles

## 2015-09-10 NOTE — Telephone Encounter (Signed)
Spoke with patient and advised that prior Brandy Hamilton was denied.

## 2015-09-16 ENCOUNTER — Other Ambulatory Visit: Payer: Self-pay | Admitting: Internal Medicine

## 2015-09-16 DIAGNOSIS — Z1231 Encounter for screening mammogram for malignant neoplasm of breast: Secondary | ICD-10-CM

## 2015-09-30 ENCOUNTER — Ambulatory Visit
Admission: RE | Admit: 2015-09-30 | Discharge: 2015-09-30 | Disposition: A | Discharge status: Home / Self Care | Payer: PRIVATE HEALTH INSURANCE | Source: Ambulatory Visit | Attending: Internal Medicine | Admitting: Internal Medicine

## 2015-09-30 DIAGNOSIS — Z1231 Encounter for screening mammogram for malignant neoplasm of breast: Secondary | ICD-10-CM | POA: Diagnosis present

## 2015-09-30 HISTORY — DX: Malignant neoplasm of unspecified site of unspecified female breast: C50.919

## 2016-02-07 ENCOUNTER — Other Ambulatory Visit: Payer: Self-pay | Admitting: *Deleted

## 2016-02-07 MED ORDER — ROPINIROLE HCL 0.5 MG PO TABS: 0.5000 mg | ORAL_TABLET | Freq: Every day | ORAL | Status: DC

## 2016-05-15 ENCOUNTER — Other Ambulatory Visit: Payer: Self-pay | Admitting: Internal Medicine

## 2016-07-08 ENCOUNTER — Other Ambulatory Visit: Payer: Self-pay | Admitting: Internal Medicine

## 2016-07-10 ENCOUNTER — Other Ambulatory Visit: Payer: Self-pay | Admitting: Internal Medicine

## 2016-07-27 ENCOUNTER — Other Ambulatory Visit: Payer: Self-pay

## 2016-07-27 ENCOUNTER — Ambulatory Visit (INDEPENDENT_AMBULATORY_CARE_PROVIDER_SITE_OTHER): Payer: Managed Care, Other (non HMO) | Admitting: Internal Medicine

## 2016-07-27 ENCOUNTER — Encounter: Payer: Self-pay | Admitting: Internal Medicine

## 2016-07-27 VITALS — BP 110/70 | HR 44 | Temp 97.9°F | Ht 62.0 in | Wt 153.0 lb

## 2016-07-27 DIAGNOSIS — G2581 Restless legs syndrome: Secondary | ICD-10-CM | POA: Diagnosis not present

## 2016-07-27 DIAGNOSIS — Z23 Encounter for immunization: Secondary | ICD-10-CM | POA: Diagnosis not present

## 2016-07-27 DIAGNOSIS — J4520 Mild intermittent asthma, uncomplicated: Secondary | ICD-10-CM

## 2016-07-27 DIAGNOSIS — I10 Essential (primary) hypertension: Secondary | ICD-10-CM

## 2016-07-27 DIAGNOSIS — Z0000 Encounter for general adult medical examination without abnormal findings: Secondary | ICD-10-CM | POA: Diagnosis not present

## 2016-07-27 DIAGNOSIS — E785 Hyperlipidemia, unspecified: Secondary | ICD-10-CM

## 2016-07-27 DIAGNOSIS — Z853 Personal history of malignant neoplasm of breast: Secondary | ICD-10-CM

## 2016-07-27 MED ORDER — LEVOCETIRIZINE DIHYDROCHLORIDE 5 MG PO TABS: 5.0000 mg | ORAL_TABLET | Freq: Every evening | ORAL | 3 refills | Status: DC

## 2016-07-27 MED ORDER — MONTELUKAST SODIUM 10 MG PO TABS: 10.0000 mg | ORAL_TABLET | Freq: Every day | ORAL | 3 refills | Status: DC

## 2016-07-27 MED ORDER — AMLODIPINE BESYLATE 10 MG PO TABS: 10.0000 mg | ORAL_TABLET | Freq: Every day | ORAL | 3 refills | Status: DC

## 2016-07-27 MED ORDER — BISOPROLOL-HYDROCHLOROTHIAZIDE 10-6.25 MG PO TABS: 1.0000 | ORAL_TABLET | Freq: Every day | ORAL | 3 refills | Status: DC

## 2016-07-27 MED ORDER — LOSARTAN POTASSIUM 100 MG PO TABS
100.0000 mg | ORAL_TABLET | Freq: Every day | ORAL | 3 refills | Status: DC
Start: 2016-07-27 — End: 2017-09-08

## 2016-07-27 NOTE — Assessment & Plan Note (Signed)
BP Readings from Last 3 Encounters:  07/27/16 110/70  07/22/15 118/70  07/16/14 120/80   Good control No change needed

## 2016-07-27 NOTE — Assessment & Plan Note (Addendum)
Healthy Discussed resuming exercise Colon due 2019

## 2016-07-27 NOTE — Assessment & Plan Note (Signed)
Satisfied with the med

## 2016-07-27 NOTE — Progress Notes (Signed)
Subjective:    Patient ID: Brandy Hamilton, female    DOB: 12-25-1955, 60 y.o.   MRN: [id number]  HPI Here for physical  Still sees Dr Humphrey Rolls about the asthma Is controlled fairly well with the allergy shots and meds Uses xopenex rarely Still will smoke an occasional cigarette--rare. (doesn't want to stop completely)  Satisfied with med for restless legs No problems with BP med  Current Outpatient Prescriptions on File Prior to Visit  Medication Sig Dispense Refill  . amLODipine (NORVASC) 10 MG tablet TAKE 1 TABLET BY MOUTH ONCE DAILY 90 tablet 3  . bisoprolol-hydrochlorothiazide (ZIAC) 10-6.25 MG per tablet TAKE 1 TABLET BY MOUTH ONCE DAILY 90 tablet 3  . Calcium Carbonate (CALCIUM 500 PO) Take by mouth daily.      . fish oil-omega-3 fatty acids 1000 MG capsule Take 2 g by mouth daily.      Marland Kitchen levalbuterol (XOPENEX HFA) 45 MCG/ACT inhaler Inhale 1-2 puffs into the lungs every 6 (six) hours as needed.    Marland Kitchen losartan (COZAAR) 100 MG tablet TAKE 1 TABLET BY MOUTH ONCE DAILY 90 tablet 3  . montelukast (SINGULAIR) 5 MG chewable tablet Chew 5 mg by mouth as needed.      . Multiple Vitamins-Iron (STRESS FORMULA/IRON) TABS Take 1 tablet by mouth daily.    . Potassium 75 MG TABS Take by mouth daily.      Marland Kitchen rOPINIRole (REQUIP) 0.5 MG tablet Take 1 tablet by mouth at  bedtime 90 tablet 3  . triamcinolone (NASACORT) 55 MCG/ACT nasal inhaler Place 2 sprays into the nose as needed.       No current facility-administered medications on file prior to visit.     Allergies  Allergen Reactions  . Beclomethasone Dipropionate Other (See Comments)    Patient unsure of reaction.  . Levofloxacin Other (See Comments)    Patient unsure of reaction.   . Ramipril     REACTION: Cough    Past Medical History:  Diagnosis Date  . Allergy    pollen, seasonal, perfumes, medications  . Asthma   . Breast cancer ([place]) 2008   radiation  . Cancer ([place]) 11/07/2007   left partial mastectomy done  12/12/2007-DCIS ER/PR neg,positive margins identified to re-excisione done   . DCIS (ductal carcinoma in situ) of breast    partial mastectomy and RT-----------------Dr Byrnett  . Hyperlipidemia   . Hypertension    1998  . Personal history of tobacco use, presenting hazards to health   . Special screening for malignant neoplasms, colon     Past Surgical History:  Procedure Laterality Date  . BREAST BIOPSY Left    1998    Breast biopsy negative - Byrnett, 3/00     Left breast biopsy- stereotactic10/05   Left breast biopsy - (-) Byrnett11/08   Left breast biopsy--L partial mastectomy for DCIS, ,   . BREAST EXCISIONAL BIOPSY Left 2008   positive  . BREAST SURGERY Left 11/07/2007   3 cm area of DCIS, resected the negative margins. PR: 0%; ER less than 5%. Treated by whole breast radiation, patient declined antiestrogen therapy.  . COLONOSCOPY  2009   hyperplastic polyp from rectum, Dr. Bary Castilla    Family History  Problem Relation Age of Onset  . Cancer Maternal Grandfather   . Cancer Other     breast cancer  . Heart disease Mother   . Hypertension Mother     Social History   Social History  . Marital status: Divorced  Spouse name: N/A  . Number of children: 0  . Years of education: N/A   Occupational History  . [place]     retired  . Bookkeeping, taxes---part time        . Rental properties   .      Social History Main Topics  . Smoking status: Current Some Day Smoker    Packs/day: 0.25    Years: 5.00    Types: Cigarettes  . Smokeless tobacco: Never Used     Comment: discussed cold [place] stopping since not an everyday smoker  . Alcohol use Yes     Comment: drinks rarely  . Drug use: No  . Sexual activity: Not on file   Other Topics Concern  . Not on file   Social History Narrative  . No narrative on file   Review of Systems  Constitutional: Negative for fatigue and unexpected weight change.       Usually wears seat belt    HENT: Negative for dental problem, hearing loss and tinnitus.        Keeps up with dentist  Eyes: Negative for visual disturbance.       No diplopia or unilateral vision loss  Respiratory: Positive for cough. Negative for chest tightness and shortness of breath.        Occ wheezing or cough  Cardiovascular: Negative for chest pain, palpitations and leg swelling.  Gastrointestinal: Negative for abdominal pain, constipation, nausea and vomiting.       Occasional red blood on toilet paper Occasional heartburn  Endocrine: Negative for polydipsia and polyuria.  Genitourinary: Negative for dyspareunia, dysuria and hematuria.  Musculoskeletal: Positive for back pain. Negative for arthralgias and joint swelling.  Skin: Negative for rash.       No suspicious lesions  Allergic/Immunologic: Positive for environmental allergies. Negative for immunocompromised state.  Neurological: Negative for dizziness, syncope and light-headedness.       Rare headaches  Hematological: Negative for adenopathy. Does not bruise/bleed easily.  Psychiatric/Behavioral: Negative for dysphoric mood and sleep disturbance. The patient is not nervous/anxious.        Objective:   Physical Exam  Constitutional: She is oriented to person, place, and time. She appears well-developed and well-nourished. No distress.  HENT:  Head: Normocephalic and atraumatic.  Right Ear: External ear normal.  Left Ear: External ear normal.  Mouth/Throat: Oropharynx is clear and moist. No oropharyngeal exudate.  Eyes: Conjunctivae are normal. Pupils are equal, round, and reactive to light.  Neck: Normal range of motion. Neck supple. No thyromegaly present.  Cardiovascular: Normal rate, regular rhythm, normal heart sounds and intact distal pulses.  Exam reveals no gallop.   No murmur heard. Pulmonary/Chest: Effort normal and breath sounds normal. No respiratory distress. She has no wheezes. She has no rales.  Abdominal: Soft. There is no  tenderness.  Musculoskeletal: She exhibits no edema or tenderness.  Lymphadenopathy:    She has no cervical adenopathy.  Neurological: She is alert and oriented to person, place, and time.  Skin: No rash noted. No erythema.  Psychiatric: She has a normal mood and affect. Her behavior is normal.          Assessment & Plan:

## 2016-07-27 NOTE — Assessment & Plan Note (Signed)
Keeps up with yearly mammograms

## 2016-07-27 NOTE — Addendum Note (Signed)
Addended by: Viviana Simpler I on: 07/27/2016 04:28 PM   Modules accepted: Orders

## 2016-07-27 NOTE — Telephone Encounter (Signed)
Rxs sent electronically.  

## 2016-07-27 NOTE — Assessment & Plan Note (Signed)
Still doesn't want statin for primary prevention

## 2016-07-27 NOTE — Patient Instructions (Signed)
DASH Eating Plan  DASH stands for "Dietary Approaches to Stop Hypertension." The DASH eating plan is a healthy eating plan that has been shown to reduce high blood pressure (hypertension). Additional health benefits may include reducing the risk of type 2 diabetes mellitus, heart disease, and stroke. The DASH eating plan may also help with weight loss.  WHAT DO I NEED TO KNOW ABOUT THE DASH EATING PLAN?  For the DASH eating plan, you will follow these general guidelines:  · Choose foods with a percent daily value for sodium of less than 5% (as listed on the food label).  · Use salt-free seasonings or herbs instead of table salt or sea salt.  · Check with your health care provider or pharmacist before using salt substitutes.  · Eat lower-sodium products, often labeled as "lower sodium" or "no salt added."  · Eat fresh foods.  · Eat more vegetables, fruits, and low-fat dairy products.  · Choose whole grains. Look for the word "whole" as the first word in the ingredient list.  · Choose fish and skinless chicken or turkey more often than red meat. Limit fish, poultry, and meat to 6 oz (170 g) each day.  · Limit sweets, desserts, sugars, and sugary drinks.  · Choose heart-healthy fats.  · Limit cheese to 1 oz (28 g) per day.  · Eat more home-cooked food and less restaurant, buffet, and fast food.  · Limit fried foods.  · Cook foods using methods other than frying.  · Limit canned vegetables. If you do use them, rinse them well to decrease the sodium.  · When eating at a restaurant, ask that your food be prepared with less salt, or no salt if possible.  WHAT FOODS CAN I EAT?  Seek help from a dietitian for individual calorie needs.  Grains  Whole grain or whole wheat bread. Brown rice. Whole grain or whole wheat pasta. Quinoa, bulgur, and whole grain cereals. Low-sodium cereals. Corn or whole wheat flour tortillas. Whole grain cornbread. Whole grain crackers. Low-sodium crackers.  Vegetables  Fresh or frozen vegetables  (raw, steamed, roasted, or grilled). Low-sodium or reduced-sodium tomato and vegetable juices. Low-sodium or reduced-sodium tomato sauce and paste. Low-sodium or reduced-sodium canned vegetables.   Fruits  All fresh, canned (in natural juice), or frozen fruits.  Meat and Other Protein Products  Ground beef (85% or leaner), grass-fed beef, or beef trimmed of fat. Skinless chicken or turkey. Ground chicken or turkey. Pork trimmed of fat. All fish and seafood. Eggs. Dried beans, peas, or lentils. Unsalted nuts and seeds. Unsalted canned beans.  Dairy  Low-fat dairy products, such as skim or 1% milk, 2% or reduced-fat cheeses, low-fat ricotta or cottage cheese, or plain low-fat yogurt. Low-sodium or reduced-sodium cheeses.  Fats and Oils  Tub margarines without trans fats. Light or reduced-fat mayonnaise and salad dressings (reduced sodium). Avocado. Safflower, olive, or canola oils. Natural peanut or almond butter.  Other  Unsalted popcorn and pretzels.  The items listed above may not be a complete list of recommended foods or beverages. Contact your dietitian for more options.  WHAT FOODS ARE NOT RECOMMENDED?  Grains  White bread. White pasta. White rice. Refined cornbread. Bagels and croissants. Crackers that contain trans fat.  Vegetables  Creamed or fried vegetables. Vegetables in a cheese sauce. Regular canned vegetables. Regular canned tomato sauce and paste. Regular tomato and vegetable juices.  Fruits  Dried fruits. Canned fruit in light or heavy syrup. Fruit juice.  Meat and Other Protein   Products  Fatty cuts of meat. Ribs, chicken wings, bacon, sausage, bologna, salami, chitterlings, fatback, hot dogs, bratwurst, and packaged luncheon meats. Salted nuts and seeds. Canned beans with salt.  Dairy  Whole or 2% milk, cream, half-and-half, and cream cheese. Whole-fat or sweetened yogurt. Full-fat cheeses or blue cheese. Nondairy creamers and whipped toppings. Processed cheese, cheese spreads, or cheese  curds.  Condiments  Onion and garlic salt, seasoned salt, table salt, and sea salt. Canned and packaged gravies. Worcestershire sauce. Tartar sauce. Barbecue sauce. Teriyaki sauce. Soy sauce, including reduced sodium. Steak sauce. Fish sauce. Oyster sauce. Cocktail sauce. Horseradish. Ketchup and mustard. Meat flavorings and tenderizers. Bouillon cubes. Hot sauce. Tabasco sauce. Marinades. Taco seasonings. Relishes.  Fats and Oils  Butter, stick margarine, lard, shortening, ghee, and bacon fat. Coconut, palm kernel, or palm oils. Regular salad dressings.  Other  Pickles and olives. Salted popcorn and pretzels.  The items listed above may not be a complete list of foods and beverages to avoid. Contact your dietitian for more information.  WHERE CAN I FIND MORE INFORMATION?  National Heart, Lung, and [employer] Institute: www.nhlbi.nih.gov/health/health-topics/topics/dash/     This information is not intended to replace advice given to you by your health care provider. Make sure you discuss any questions you have with your health care provider.     Document Released: 11/26/2011 Document Revised: 12/28/2014 Document Reviewed: 10/11/2013  Elsevier Interactive Patient Education ©2016 Elsevier Inc.

## 2016-07-27 NOTE — Progress Notes (Signed)
Pre visit review using our clinic review tool, if applicable. No additional management support is needed unless otherwise documented below in the visit note. 

## 2016-07-27 NOTE — Addendum Note (Signed)
Addended by: Pilar Grammes on: 07/27/2016 05:15 PM   Modules accepted: Orders

## 2016-07-27 NOTE — Assessment & Plan Note (Signed)
Will increase montelukast

## 2016-07-28 LAB — CBC WITH DIFFERENTIAL/PLATELET
BASOS ABS: 0.1 10*3/uL (ref 0.0–0.1)
BASOS PCT: 1.1 % (ref 0.0–3.0)
EOS ABS: 0.3 10*3/uL (ref 0.0–0.7)
Eosinophils Relative: 3.8 % (ref 0.0–5.0)
HCT: 41 % (ref 36.0–46.0)
Hemoglobin: 14.1 g/dL (ref 12.0–15.0)
Lymphocytes Relative: 20.5 % (ref 12.0–46.0)
Lymphs Abs: 1.5 10*3/uL (ref 0.7–4.0)
MCHC: 34.3 g/dL (ref 30.0–36.0)
MCV: 97 fl (ref 78.0–100.0)
MONO ABS: 0.4 10*3/uL (ref 0.1–1.0)
Monocytes Relative: 4.8 % (ref 3.0–12.0)
NEUTROS ABS: 5.1 10*3/uL (ref 1.4–7.7)
NEUTROS PCT: 69.8 % (ref 43.0–77.0)
PLATELETS: 252 10*3/uL (ref 150.0–400.0)
RBC: 4.23 Mil/uL (ref 3.87–5.11)
RDW: 12.6 % (ref 11.5–15.5)
WBC: 7.3 10*3/uL (ref 4.0–10.5)

## 2016-07-28 LAB — COMPREHENSIVE METABOLIC PANEL
ALBUMIN: 4.3 g/dL (ref 3.5–5.2)
ALT: 23 U/L (ref 0–35)
AST: 20 U/L (ref 0–37)
Alkaline Phosphatase: 92 U/L (ref 39–117)
BILIRUBIN TOTAL: 0.5 mg/dL (ref 0.2–1.2)
BUN: 19 mg/dL (ref 6–23)
CALCIUM: 10.7 mg/dL — AB (ref 8.4–10.5)
CHLORIDE: 106 meq/L (ref 96–112)
CO2: 25 meq/L (ref 19–32)
CREATININE: 0.82 mg/dL (ref 0.40–1.20)
GFR: 76.08 mL/min (ref >60.00)
Glucose, Bld: 94 mg/dL (ref 70–99)
POTASSIUM: 4 meq/L (ref 3.5–5.1)
Sodium: 140 mEq/L (ref 135–145)
Total Protein: 7.3 g/dL (ref 6.0–8.3)

## 2016-07-28 LAB — LIPID PANEL
CHOLESTEROL: 225 mg/dL — AB (ref 0–200)
HDL: 39.6 mg/dL (ref >39.00)
NonHDL: 185.23
TRIGLYCERIDES: 243 mg/dL — AB (ref 0.0–149.0)
Total CHOL/HDL Ratio: 6
VLDL: 48.6 mg/dL — ABNORMAL HIGH (ref 0.0–40.0)

## 2016-07-28 LAB — T4, FREE: FREE T4: 1.06 ng/dL (ref 0.60–1.60)

## 2016-07-28 LAB — LDL CHOLESTEROL, DIRECT: LDL DIRECT: 145 mg/dL

## 2016-08-26 ENCOUNTER — Other Ambulatory Visit: Payer: Self-pay | Admitting: Internal Medicine

## 2016-08-26 DIAGNOSIS — Z1231 Encounter for screening mammogram for malignant neoplasm of breast: Secondary | ICD-10-CM

## 2016-10-05 ENCOUNTER — Ambulatory Visit
Admission: RE | Admit: 2016-10-05 | Discharge: 2016-10-05 | Disposition: A | Discharge status: Home / Self Care | Payer: Managed Care, Other (non HMO) | Source: Ambulatory Visit | Attending: Internal Medicine | Admitting: Internal Medicine

## 2016-10-05 DIAGNOSIS — Z1231 Encounter for screening mammogram for malignant neoplasm of breast: Secondary | ICD-10-CM | POA: Insufficient documentation

## 2016-10-28 ENCOUNTER — Ambulatory Visit (INDEPENDENT_AMBULATORY_CARE_PROVIDER_SITE_OTHER): Payer: Managed Care, Other (non HMO)

## 2016-10-28 ENCOUNTER — Ambulatory Visit
Admission: EM | Admit: 2016-10-28 | Discharge: 2016-10-28 | Disposition: A | Discharge status: Home / Self Care | Payer: Managed Care, Other (non HMO) | Attending: Internal Medicine | Admitting: Internal Medicine

## 2016-10-28 ENCOUNTER — Encounter: Payer: Self-pay | Admitting: Emergency Medicine

## 2016-10-28 DIAGNOSIS — S92302A Fracture of unspecified metatarsal bone(s), left foot, initial encounter for closed fracture: Secondary | ICD-10-CM

## 2016-10-28 MED ORDER — OXYCODONE-ACETAMINOPHEN 5-325 MG PO TABS: 1.0000 | ORAL_TABLET | ORAL | 0 refills | Status: DC | PRN

## 2016-10-28 NOTE — ED Triage Notes (Signed)
Patient c/o left foot pain after falling early this morning.

## 2016-10-28 NOTE — ED Provider Notes (Signed)
[facility]    CSN: [id number] Arrival date & time: 10/28/16  1820     History   Chief Complaint Chief Complaint  Patient presents with  . Foot Pain    HPI Brandy Hamilton is a 60 y.o. female.   Patient reports tripping and falling as she awoke disoriented last night.  She has not been able to bear weight on her left foot.        Past Medical History:  Diagnosis Date  . Allergy    pollen, seasonal, perfumes, medications  . Asthma   . Breast cancer ([place]) 2008   radiation  . Cancer ([place]) 11/07/2007   left partial mastectomy done 12/12/2007-DCIS ER/PR neg,positive margins identified to re-excisione done   . DCIS (ductal carcinoma in situ) of breast    partial mastectomy and RT-----------------Dr Byrnett  . Hyperlipidemia   . Hypertension    1998  . Personal history of tobacco use, presenting hazards to health   . Special screening for malignant neoplasms, colon     Patient Active Problem List   Diagnosis Date Noted  . History of breast cancer 10/19/2013  . Routine general medical examination at a health care facility 06/30/2012  . RESTLESS LEG SYNDROME 07/05/2009  . Hyperlipemia 11/04/2007  . Essential hypertension, benign 11/04/2007  . BRADYCARDIA 11/04/2007  . ALLERGIC RHINITIS 11/04/2007  . Asthma, allergic 11/04/2007    Past Surgical History:  Procedure Laterality Date  . BREAST BIOPSY Left    1998    Breast biopsy negative - Byrnett, 3/00     Left breast biopsy- stereotactic10/05   Left breast biopsy - (-) Byrnett11/08   Left breast biopsy--L partial mastectomy for DCIS, ,   . BREAST EXCISIONAL BIOPSY Left 2008   positive  . BREAST SURGERY Left 11/07/2007   3 cm area of DCIS, resected the negative margins. PR: 0%; ER less than 5%. Treated by whole breast radiation, patient declined antiestrogen therapy.  . COLONOSCOPY  2009   hyperplastic polyp from rectum, Dr. Bary Castilla    OB History    Gravida Para Term Preterm AB Living   0               SAB TAB Ectopic Multiple Live Births                  Obstetric Comments   Age with first menstruation-16       Home Medications    Prior to Admission medications   Medication Sig Start Date End Date Taking? Authorizing Provider  amLODipine (NORVASC) 10 MG tablet Take 1 tablet (10 mg total) by mouth daily. 07/27/16   Venia Carbon, MD  bisoprolol-hydrochlorothiazide [facility]) 10-6.25 MG tablet Take 1 tablet by mouth daily. 07/27/16   Venia Carbon, MD  Calcium Carbonate (CALCIUM 500 PO) Take by mouth daily.      Historical Provider, MD  fish oil-omega-3 fatty acids 1000 MG capsule Take 2 g by mouth daily.      Historical Provider, MD  levalbuterol [facility] HFA) 45 MCG/ACT inhaler Inhale 1-2 puffs into the lungs every 6 (six) hours as needed.    Historical Provider, MD  levocetirizine (XYZAL) 5 MG tablet Take 1 tablet (5 mg total) by mouth every evening. 07/27/16   Venia Carbon, MD  losartan (COZAAR) 100 MG tablet Take 1 tablet (100 mg total) by mouth daily. 07/27/16   Venia Carbon, MD  montelukast (SINGULAIR) 10 MG tablet Take 1 tablet (10 mg total) by  mouth at bedtime. 07/27/16   Venia Carbon, MD  Multiple Vitamins-Iron (STRESS FORMULA/IRON) TABS Take 1 tablet by mouth daily.    Historical Provider, MD  oxyCODONE-acetaminophen (PERCOCET/ROXICET) 5-325 MG tablet Take 1 tablet by mouth every 4 (four) hours as needed for severe pain. 10/28/16   Harrie Foreman, MD  Potassium 75 MG TABS Take by mouth daily.      Historical Provider, MD  rOPINIRole (REQUIP) 0.5 MG tablet Take 1 tablet by mouth at  bedtime 07/08/16   Venia Carbon, MD    Family History Family History  Problem Relation Age of Onset  . Cancer Maternal Grandfather   . Cancer Other     breast cancer  . Heart disease Mother   . Hypertension Mother     Social History Social History  Substance Use Topics  . Smoking status: Current Some Day Smoker    Packs/day: 0.25    Years: 5.00    Types: Cigarettes  .  Smokeless tobacco: Never Used     Comment: discussed cold [place] stopping since not an everyday smoker  . Alcohol use Yes     Comment: drinks rarely     Allergies   Beclomethasone dipropionate; Levofloxacin; and Ramipril   Review of Systems Review of Systems  Constitutional: Negative for chills and fever.  HENT: Negative for sore throat and tinnitus.   Eyes: Negative for redness.  Respiratory: Negative for cough and shortness of breath.   Cardiovascular: Negative for chest pain and palpitations.  Gastrointestinal: Negative for abdominal pain, diarrhea, nausea and vomiting.  Genitourinary: Negative for dysuria, frequency and urgency.  Musculoskeletal: Positive for arthralgias. Negative for myalgias.  Skin: Negative for rash.       No lesions  Neurological: Negative for weakness.  Hematological: Does not bruise/bleed easily.  Psychiatric/Behavioral: Negative for suicidal ideas.     Physical Exam Triage Vital Signs ED Triage Vitals  Enc Vitals Group     BP 10/28/16 1838 134/82     Pulse Rate 10/28/16 1838 64     Resp 10/28/16 1838 16     Temp 10/28/16 1838 97.8 F (36.6 C)     Temp Source 10/28/16 1838 Tympanic     SpO2 10/28/16 1838 100 %     Weight 10/28/16 1834 148 lb (67.1 kg)     Height 10/28/16 1834 5\' 3"  (1.6 m)     Head Circumference --      Peak Flow --      Pain Score 10/28/16 1837 10     Pain Loc --      Pain Edu? --      Excl. in [place]? --    No data found.   Updated Vital Signs BP 134/82 (BP Location: Left Arm)   Pulse 64   Temp 97.8 F (36.6 C) (Tympanic)   Resp 16   Ht 5\' 3"  (1.6 m)   Wt 148 lb (67.1 kg)   SpO2 100%   BMI 26.22 kg/m   Visual Acuity Right Eye Distance:   Left Eye Distance:   Bilateral Distance:    Right Eye Near:   Left Eye Near:    Bilateral Near:     Physical Exam  Constitutional: She is oriented to person, place, and time. She appears well-developed and well-nourished. No distress.  HENT:  Head: Normocephalic and  atraumatic.  Mouth/Throat: Oropharynx is clear and moist.  Eyes: Conjunctivae and EOM are normal. Pupils are equal, round, and reactive to light. No scleral icterus.  Neck: Normal range of motion. Neck supple. No JVD present. No tracheal deviation present. No thyromegaly present.  Cardiovascular: Normal rate, regular rhythm and normal heart sounds.  Exam reveals no gallop and no friction rub.   No murmur heard. Pulmonary/Chest: Effort normal and breath sounds normal.  Abdominal: Soft. Bowel sounds are normal. She exhibits no distension. There is no tenderness.  Musculoskeletal: Normal range of motion. She exhibits tenderness (lateral metatarsals of left foot). She exhibits no edema.  Lymphadenopathy:    She has no cervical adenopathy.  Neurological: She is alert and oriented to person, place, and time. No cranial nerve deficit.  Skin: Skin is warm and dry.  Psychiatric: She has a normal mood and affect. Her behavior is normal. Judgment and thought content normal.  Nursing note and vitals reviewed.    UC Treatments / Results  Labs (all labs ordered are listed, but only abnormal results are displayed) Labs Reviewed - No data to display  EKG  EKG Interpretation None       Radiology Dg Foot Complete Left  Result Date: 10/28/2016 CLINICAL DATA:  Pain following fall EXAM: LEFT FOOT - COMPLETE 3+ VIEW COMPARISON:  None. FINDINGS: Frontal, oblique, and lateral views were obtained. There are fractures of the distal second, third, fourth, and fifth metatarsals. The fracture at the distal second metatarsal is near anatomic in alignment. There is slight separation of fracture fragments at the distal third metatarsal. There is lateral angulation of the distal fourth and fifth metatarsal fracture fragments with respect to the proximal fragments. No other fractures. No dislocations. There is narrowing of the first MTP joint. Other joint spaces appear normal. There are posterior and inferior  calcaneal spurs. IMPRESSION: Fractures of the distal second, third, fourth, and fifth metatarsals. No dislocation. Narrowing first MTP joint. There are calcaneal spurs. Electronically Signed   By: Lowella Grip III M.D.   On: 10/28/2016 19:10    Procedures Procedures (including critical care time)  Medications Ordered in UC Medications - No data to display   Initial Impression / Assessment and Plan / UC Course  I have reviewed the triage vital signs and the nursing notes.  Pertinent labs & imaging results that were available during my care of the patient were reviewed by me and considered in my medical decision making (see chart for details).  Clinical Course     Given walking boot and infor for urgent ortho follow-up.  Final Clinical Impressions(s) / UC Diagnoses   Final diagnoses:  Closed displaced fracture of metatarsal bone of left foot, unspecified metatarsal, initial encounter    New Prescriptions Discharge Medication List as of 10/28/2016  7:38 PM    START taking these medications   Details  oxyCODONE-acetaminophen (PERCOCET/ROXICET) 5-325 MG tablet Take 1 tablet by mouth every 4 (four) hours as needed for severe pain., Starting Wed 10/28/2016, Print         Harrie Foreman, MD 10/28/16 [phone]

## 2016-10-29 DIAGNOSIS — S92309A Fracture of unspecified metatarsal bone(s), unspecified foot, initial encounter for closed fracture: Secondary | ICD-10-CM | POA: Insufficient documentation

## 2016-11-06 ENCOUNTER — Telehealth: Payer: Self-pay

## 2016-11-06 NOTE — Telephone Encounter (Signed)
Tried to call pt to see how she was doing after her visit to the ER for foot fx on 10-28-16. No answer and no vm.

## 2017-01-21 HISTORY — PX: BELPHAROPTOSIS REPAIR: SHX369

## 2017-04-24 ENCOUNTER — Other Ambulatory Visit: Payer: Self-pay | Admitting: Internal Medicine

## 2017-08-02 ENCOUNTER — Encounter: Payer: Managed Care, Other (non HMO) | Admitting: Internal Medicine

## 2017-08-09 ENCOUNTER — Encounter: Payer: Managed Care, Other (non HMO) | Admitting: Internal Medicine

## 2017-08-16 ENCOUNTER — Ambulatory Visit (INDEPENDENT_AMBULATORY_CARE_PROVIDER_SITE_OTHER): Payer: Managed Care, Other (non HMO) | Admitting: Internal Medicine

## 2017-08-16 ENCOUNTER — Encounter: Payer: Self-pay | Admitting: Internal Medicine

## 2017-08-16 VITALS — BP 124/84 | HR 50 | Temp 97.6°F | Ht 62.5 in | Wt 153.0 lb

## 2017-08-16 DIAGNOSIS — J4520 Mild intermittent asthma, uncomplicated: Secondary | ICD-10-CM

## 2017-08-16 DIAGNOSIS — Z0000 Encounter for general adult medical examination without abnormal findings: Secondary | ICD-10-CM | POA: Diagnosis not present

## 2017-08-16 DIAGNOSIS — I10 Essential (primary) hypertension: Secondary | ICD-10-CM

## 2017-08-16 DIAGNOSIS — Z853 Personal history of malignant neoplasm of breast: Secondary | ICD-10-CM | POA: Diagnosis not present

## 2017-08-16 DIAGNOSIS — E785 Hyperlipidemia, unspecified: Secondary | ICD-10-CM

## 2017-08-16 NOTE — Assessment & Plan Note (Signed)
Breast exam benign Will get mammo in October

## 2017-08-16 NOTE — Assessment & Plan Note (Signed)
Healthy Discussed fitness Colon due next year mammo due in 2 months Pap due 2021 Recommended yearly flu vaccines

## 2017-08-16 NOTE — Assessment & Plan Note (Signed)
BP Readings from Last 3 Encounters:  08/16/17 124/84  10/28/16 134/82  07/27/16 110/70   Good control Will check routine labs for her meds

## 2017-08-16 NOTE — Progress Notes (Signed)
Subjective:    Patient ID: Brandy Hamilton, female    DOB: [birth year], 61 y.o.   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 amLODipine (NORVASC) 10 MG tablet Take 1 tablet (10 mg total) by mouth daily. 90 tablet 3  . bisoprolol-hydrochlorothiazide (ZIAC) 10-6.25 MG tablet Take 1 tablet by mouth daily. 90 tablet 3  . Calcium Carbonate (CALCIUM 500 PO) Take by mouth daily.      . fish oil-omega-3 fatty acids 1000 MG capsule Take 2 g by mouth daily.      Marland Kitchen levalbuterol (XOPENEX HFA) 45 MCG/ACT inhaler Inhale 1-2 puffs into the lungs every 6 (six) hours as needed.    Marland Kitchen levocetirizine (XYZAL) 5 MG tablet TAKE 1 TABLET BY MOUTH  EVERY EVENING 90 tablet 0  . losartan (COZAAR) 100 MG tablet Take 1 tablet (100 mg total) by mouth daily. 90 tablet 3  . montelukast (SINGULAIR) 10 MG tablet TAKE 1 TABLET BY MOUTH AT  BEDTIME 90 tablet 0  . Multiple Vitamins-Iron (STRESS FORMULA/IRON) TABS Take 1 tablet by mouth daily.    . Potassium 75 MG TABS Take by mouth daily.      Marland Kitchen rOPINIRole (REQUIP) 0.5 MG tablet TAKE 1 TABLET BY MOUTH AT  BEDTIME 90 tablet 0   No current facility-administered medications on file prior to visit.     Allergies  Allergen Reactions  . Beclomethasone Dipropionate Other (See  Comments)    Patient unsure of reaction.  . Levofloxacin Other (See Comments)    Patient unsure of reaction.   . Ramipril     REACTION: Cough    Past Medical History:  Diagnosis Date  . Allergy    pollen, seasonal, perfumes, medications  . Asthma   . Breast cancer ([place]) 2008   radiation  . Cancer ([place]) 11/07/2007   left partial mastectomy done 12/12/2007-DCIS ER/PR neg,positive margins identified to re-excisione done   . DCIS (ductal carcinoma in situ) of breast    partial mastectomy and RT-----------------Dr Byrnett  . Hyperlipidemia   . Hypertension    1998  . Personal history of tobacco use, presenting hazards to health   . Special screening for malignant neoplasms, colon     Past Surgical History:  Procedure Laterality Date  . BELPHAROPTOSIS REPAIR Bilateral 01/2017  . BREAST BIOPSY Left    1998    Breast biopsy negative - Byrnett, 3/00     Left breast biopsy- stereotactic10/05   Left breast biopsy - (-) Byrnett11/08   Left breast biopsy--L partial mastectomy for DCIS, ,   . BREAST EXCISIONAL BIOPSY Left 2008   positive  . BREAST SURGERY Left 11/07/2007   3 cm area of DCIS, resected the negative margins. PR: 0%; ER less than 5%. Treated by whole breast radiation, patient declined antiestrogen therapy.  Marland Kitchen  COLONOSCOPY  2009   hyperplastic polyp from rectum, Dr. Bary Castilla    Family History  Problem Relation Age of Onset  . Cancer Maternal Grandfather   . Cancer Other        breast cancer  . Heart disease Mother   . Hypertension Mother     Social History   Social History  . Marital status: Divorced    Spouse name: N/A  . Number of children: 0  . Years of education: N/A   Occupational History  . [employer]     retired  . Bookkeeping, taxes---part time        . Rental properties   .      Social History Main Topics  . Smoking status: Current Some Day Smoker    Packs/day: 0.25    Years: 5.00    Types: Cigarettes  . Smokeless  tobacco: Never Used     Comment: discussed cold [place] stopping since not an everyday smoker  . Alcohol use Yes     Comment: drinks rarely  . Drug use: No  . Sexual activity: Not on file   Other Topics Concern  . Not on file   Social History Narrative  . No narrative on file   Review of Systems  Constitutional: Negative for fatigue and unexpected weight change.       Wears seat betl  HENT: Positive for dental problem. Negative for hearing loss and tinnitus.   Eyes: Negative for visual disturbance.       No diplopia or unilateral vision loss  Respiratory: Positive for cough. Negative for chest tightness and shortness of breath.        Still smokes some---not every day. Discussed cessation again  Cardiovascular: Negative for chest pain, palpitations and leg swelling.  Gastrointestinal: Negative for constipation and diarrhea.       Occ blood on toilet paper Occasional heartburn--tums helps  Endocrine: Negative for polydipsia and polyuria.  Genitourinary: Negative for dyspareunia, dysuria and hematuria.  Musculoskeletal: Negative for arthralgias, back pain and joint swelling.  Skin: Negative for rash.  Allergic/Immunologic: Negative for environmental allergies and immunocompromised state.  Neurological: Negative for syncope, light-headedness and headaches.       Dizzy at times if she skips a meal--better after eating  Hematological: Negative for adenopathy. Bruises/bleeds easily.  Psychiatric/Behavioral: Negative for dysphoric mood. The patient is not nervous/anxious.        Sleep off schedule       Objective:   Physical Exam  Constitutional: She is oriented to person, place, and time. She appears well-developed. No distress.  HENT:  Head: Normocephalic and atraumatic.  Right Ear: External ear normal.  Left Ear: External ear normal.  Mouth/Throat: Oropharynx is clear and moist. No oropharyngeal exudate.  Eyes: Pupils are equal, round, and reactive to light. Conjunctivae are  normal.  Neck: Normal range of motion. No thyromegaly present.  Cardiovascular: Normal rate, regular rhythm, normal heart sounds and intact distal pulses.  Exam reveals no gallop.   No murmur heard. Pulmonary/Chest: Effort normal and breath sounds normal. No respiratory distress. She has no wheezes. She has no rales.  Abdominal: Soft. There is no tenderness.  Genitourinary:  Genitourinary Comments: Breast thickening at scar area--but no masses Cystic changes---slight on left medial to scar, much more on right  Musculoskeletal: She exhibits no edema or tenderness.  Lymphadenopathy:    She has no cervical adenopathy.  Neurological: She is alert and oriented to person, place, and time.  Skin: No rash noted. No erythema.  No lesion on calf  Psychiatric: She has a normal mood and affect. Her behavior is normal.          Assessment & Plan:

## 2017-08-16 NOTE — Assessment & Plan Note (Signed)
Okay with the montelukast

## 2017-08-16 NOTE — Assessment & Plan Note (Signed)
Prefers no Rx

## 2017-08-16 NOTE — Patient Instructions (Signed)
DASH Eating Plan DASH stands for "Dietary Approaches to Stop Hypertension." The DASH eating plan is a healthy eating plan that has been shown to reduce high blood pressure (hypertension). It may also reduce your risk for type 2 diabetes, heart disease, and stroke. The DASH eating plan may also help with weight loss. What are tips for following this plan? General guidelines  Avoid eating more than 2,300 mg (milligrams) of salt (sodium) a day. If you have hypertension, you may need to reduce your sodium intake to 1,500 mg a day.  Limit alcohol intake to no more than 1 drink a day for nonpregnant women and 2 drinks a day for men. One drink equals 12 oz of beer, 5 oz of wine, or 1 oz of hard liquor.  Work with your health care provider to maintain a healthy body weight or to lose weight. Ask what an ideal weight is for you.  Get at least 30 minutes of exercise that causes your heart to beat faster (aerobic exercise) most days of the week. Activities may include walking, swimming, or biking.  Work with your health care provider or diet and nutrition specialist (dietitian) to adjust your eating plan to your individual calorie needs. Reading food labels  Check food labels for the amount of sodium per serving. Choose foods with less than 5 percent of the Daily Value of sodium. Generally, foods with less than 300 mg of sodium per serving fit into this eating plan.  To find whole grains, look for the word "whole" as the first word in the ingredient list. Shopping  Buy products labeled as "low-sodium" or "no salt added."  Buy fresh foods. Avoid canned foods and premade or frozen meals. Cooking  Avoid adding salt when cooking. Use salt-free seasonings or herbs instead of table salt or sea salt. Check with your health care provider or pharmacist before using salt substitutes.  Do not fry foods. Cook foods using healthy methods such as baking, boiling, grilling, and broiling instead.  Cook with  heart-healthy oils, such as olive, canola, soybean, or sunflower oil. Meal planning   Eat a balanced diet that includes: ? 5 or more servings of fruits and vegetables each day. At each meal, try to fill half of your plate with fruits and vegetables. ? Up to 6-8 servings of whole grains each day. ? Less than 6 oz of lean meat, poultry, or fish each day. A 3-oz serving of meat is about the same size as a deck of cards. One egg equals 1 oz. ? 2 servings of low-fat dairy each day. ? A serving of nuts, seeds, or beans 5 times each week. ? Heart-healthy fats. Healthy fats called Omega-3 fatty acids are found in foods such as flaxseeds and coldwater fish, like sardines, salmon, and mackerel.  Limit how much you eat of the following: ? Canned or prepackaged foods. ? Food that is high in trans fat, such as fried foods. ? Food that is high in saturated fat, such as fatty meat. ? Sweets, desserts, sugary drinks, and other foods with added sugar. ? Full-fat dairy products.  Do not salt foods before eating.  Try to eat at least 2 vegetarian meals each week.  Eat more home-cooked food and less restaurant, buffet, and fast food.  When eating at a restaurant, ask that your food be prepared with less salt or no salt, if possible. What foods are recommended? The items listed may not be a complete list. Talk with your dietitian about what   dietary choices are best for you. Grains Whole-grain or whole-wheat bread. Whole-grain or whole-wheat pasta. Brown rice. Oatmeal. Quinoa. Bulgur. Whole-grain and low-sodium cereals. Pita bread. Low-fat, low-sodium crackers. Whole-wheat flour tortillas. Vegetables Fresh or frozen vegetables (raw, steamed, roasted, or grilled). Low-sodium or reduced-sodium tomato and vegetable juice. Low-sodium or reduced-sodium tomato sauce and tomato paste. Low-sodium or reduced-sodium canned vegetables. Fruits All fresh, dried, or frozen fruit. Canned fruit in natural juice (without  added sugar). Meat and other protein foods Skinless chicken or turkey. Ground chicken or turkey. Pork with fat trimmed off. Fish and seafood. Egg whites. Dried beans, peas, or lentils. Unsalted nuts, nut butters, and seeds. Unsalted canned beans. Lean cuts of beef with fat trimmed off. Low-sodium, lean deli meat. Dairy Low-fat (1%) or fat-free (skim) milk. Fat-free, low-fat, or reduced-fat cheeses. Nonfat, low-sodium ricotta or cottage cheese. Low-fat or nonfat yogurt. Low-fat, low-sodium cheese. Fats and oils Soft margarine without trans fats. Vegetable oil. Low-fat, reduced-fat, or light mayonnaise and salad dressings (reduced-sodium). Canola, safflower, olive, soybean, and sunflower oils. Avocado. Seasoning and other foods Herbs. Spices. Seasoning mixes without salt. Unsalted popcorn and pretzels. Fat-free sweets. What foods are not recommended? The items listed may not be a complete list. Talk with your dietitian about what dietary choices are best for you. Grains Baked goods made with fat, such as croissants, muffins, or some breads. Dry pasta or rice meal packs. Vegetables Creamed or fried vegetables. Vegetables in a cheese sauce. Regular canned vegetables (not low-sodium or reduced-sodium). Regular canned tomato sauce and paste (not low-sodium or reduced-sodium). Regular tomato and vegetable juice (not low-sodium or reduced-sodium). Pickles. Olives. Fruits Canned fruit in a light or heavy syrup. Fried fruit. Fruit in cream or butter sauce. Meat and other protein foods Fatty cuts of meat. Ribs. Fried meat. Bacon. Sausage. Bologna and other processed lunch meats. Salami. Fatback. Hotdogs. Bratwurst. Salted nuts and seeds. Canned beans with added salt. Canned or smoked fish. Whole eggs or egg yolks. Chicken or turkey with skin. Dairy Whole or 2% milk, cream, and half-and-half. Whole or full-fat cream cheese. Whole-fat or sweetened yogurt. Full-fat cheese. Nondairy creamers. Whipped toppings.  Processed cheese and cheese spreads. Fats and oils Butter. Stick margarine. Lard. Shortening. Ghee. Bacon fat. Tropical oils, such as coconut, palm kernel, or palm oil. Seasoning and other foods Salted popcorn and pretzels. Onion salt, garlic salt, seasoned salt, table salt, and sea salt. Worcestershire sauce. Tartar sauce. Barbecue sauce. Teriyaki sauce. Soy sauce, including reduced-sodium. Steak sauce. Canned and packaged gravies. Fish sauce. Oyster sauce. Cocktail sauce. Horseradish that you find on the shelf. Ketchup. Mustard. Meat flavorings and tenderizers. Bouillon cubes. Hot sauce and Tabasco sauce. Premade or packaged marinades. Premade or packaged taco seasonings. Relishes. Regular salad dressings. Where to find more information:  National Heart, Lung, and Blood Institute: www.nhlbi.nih.gov  American Heart Association: www.heart.org Summary  The DASH eating plan is a healthy eating plan that has been shown to reduce high blood pressure (hypertension). It may also reduce your risk for type 2 diabetes, heart disease, and stroke.  With the DASH eating plan, you should limit salt (sodium) intake to 2,300 mg a day. If you have hypertension, you may need to reduce your sodium intake to 1,500 mg a day.  When on the DASH eating plan, aim to eat more fresh fruits and vegetables, whole grains, lean proteins, low-fat dairy, and heart-healthy fats.  Work with your health care provider or diet and nutrition specialist (dietitian) to adjust your eating plan to your individual   calorie needs. This information is not intended to replace advice given to you by your health care provider. Make sure you discuss any questions you have with your health care provider. Document Released: 11/26/2011 Document Revised: 11/30/2016 Document Reviewed: 11/30/2016 Elsevier Interactive Patient Education  2017 Elsevier Inc.  

## 2017-08-17 LAB — COMPREHENSIVE METABOLIC PANEL
ALT: 17 U/L (ref 0–35)
AST: 19 U/L (ref 0–37)
Albumin: 4.3 g/dL (ref 3.5–5.2)
Alkaline Phosphatase: 75 U/L (ref 39–117)
BUN: 22 mg/dL (ref 6–23)
CALCIUM: 10.5 mg/dL (ref 8.4–10.5)
CHLORIDE: 104 meq/L (ref 96–112)
CO2: 28 meq/L (ref 19–32)
CREATININE: 0.99 mg/dL (ref 0.40–1.20)
GFR: 60.99 mL/min (ref >60.00)
Glucose, Bld: 86 mg/dL (ref 70–99)
Potassium: 4.2 mEq/L (ref 3.5–5.1)
SODIUM: 139 meq/L (ref 135–145)
Total Bilirubin: 0.5 mg/dL (ref 0.2–1.2)
Total Protein: 7.2 g/dL (ref 6.0–8.3)

## 2017-08-17 LAB — CBC WITH DIFFERENTIAL/PLATELET
BASOS PCT: 1.2 % (ref 0.0–3.0)
Basophils Absolute: 0.1 10*3/uL (ref 0.0–0.1)
EOS ABS: 0.4 10*3/uL (ref 0.0–0.7)
Eosinophils Relative: 3.9 % (ref 0.0–5.0)
HEMATOCRIT: 40.8 % (ref 36.0–46.0)
Hemoglobin: 13.9 g/dL (ref 12.0–15.0)
LYMPHS PCT: 19.3 % (ref 12.0–46.0)
Lymphs Abs: 1.7 10*3/uL (ref 0.7–4.0)
MCHC: 34 g/dL (ref 30.0–36.0)
MCV: 100.2 fl — ABNORMAL HIGH (ref 78.0–100.0)
Monocytes Absolute: 0.5 10*3/uL (ref 0.1–1.0)
Monocytes Relative: 5.9 % (ref 3.0–12.0)
NEUTROS ABS: 6.3 10*3/uL (ref 1.4–7.7)
Neutrophils Relative %: 69.7 % (ref 43.0–77.0)
PLATELETS: 254 10*3/uL (ref 150.0–400.0)
RBC: 4.07 Mil/uL (ref 3.87–5.11)
RDW: 12.4 % (ref 11.5–15.5)
WBC: 9.1 10*3/uL (ref 4.0–10.5)

## 2017-08-19 ENCOUNTER — Other Ambulatory Visit: Payer: Self-pay | Admitting: Internal Medicine

## 2017-08-19 DIAGNOSIS — Z1231 Encounter for screening mammogram for malignant neoplasm of breast: Secondary | ICD-10-CM

## 2017-09-08 ENCOUNTER — Other Ambulatory Visit: Payer: Self-pay | Admitting: Internal Medicine

## 2017-09-13 ENCOUNTER — Encounter: Payer: Self-pay | Admitting: Physician Assistant

## 2017-09-13 ENCOUNTER — Ambulatory Visit: Payer: Self-pay | Admitting: Physician Assistant

## 2017-09-13 VITALS — BP 120/80 | HR 60 | Temp 97.8°F | Resp 16

## 2017-09-13 DIAGNOSIS — H9202 Otalgia, left ear: Secondary | ICD-10-CM

## 2017-09-13 NOTE — Progress Notes (Signed)
S: c/o left ear pain, has a bad tooth on that side also, no fever/chills, feels better than she did yesterday, states it feels like a lot of pressure, ?if wax buildup  O: vitals wnl, nad, tms clear, ear canals do not have any wax in them, throat wnl, neck supple no lymph, lungs c  t a, cv rrr  A: ear pain  P: reassurance

## 2017-09-28 ENCOUNTER — Other Ambulatory Visit: Payer: Self-pay | Admitting: Internal Medicine

## 2017-09-28 NOTE — Telephone Encounter (Signed)
Approved: okay x 1 year 

## 2017-10-08 ENCOUNTER — Ambulatory Visit
Admission: RE | Admit: 2017-10-08 | Discharge: 2017-10-08 | Disposition: A | Discharge status: Home / Self Care | Payer: Managed Care, Other (non HMO) | Source: Ambulatory Visit | Attending: Internal Medicine | Admitting: Internal Medicine

## 2017-10-08 DIAGNOSIS — Z1231 Encounter for screening mammogram for malignant neoplasm of breast: Secondary | ICD-10-CM

## 2018-04-06 ENCOUNTER — Other Ambulatory Visit: Payer: Self-pay

## 2018-04-06 MED ORDER — AMLODIPINE BESYLATE 10 MG PO TABS: 10.0000 mg | ORAL_TABLET | Freq: Every day | ORAL | 1 refills | Status: DC

## 2018-04-06 MED ORDER — BISOPROLOL-HYDROCHLOROTHIAZIDE 10-6.25 MG PO TABS: 1.0000 | ORAL_TABLET | Freq: Every day | ORAL | 1 refills | Status: DC

## 2018-04-06 MED ORDER — MONTELUKAST SODIUM 10 MG PO TABS: 10.0000 mg | ORAL_TABLET | Freq: Every day | ORAL | 1 refills | Status: DC

## 2018-04-06 MED ORDER — LOSARTAN POTASSIUM 100 MG PO TABS: 100.0000 mg | ORAL_TABLET | Freq: Every day | ORAL | 1 refills | Status: DC

## 2018-04-06 MED ORDER — ROPINIROLE HCL 0.5 MG PO TABS: 0.5000 mg | ORAL_TABLET | Freq: Every day | ORAL | 1 refills | Status: DC

## 2018-07-06 ENCOUNTER — Encounter: Payer: Self-pay | Admitting: *Deleted

## 2018-08-09 ENCOUNTER — Ambulatory Visit: Payer: No Typology Code available for payment source | Admitting: General Surgery

## 2018-08-18 ENCOUNTER — Encounter: Payer: Self-pay | Admitting: General Surgery

## 2018-08-18 ENCOUNTER — Ambulatory Visit: Payer: Managed Care, Other (non HMO) | Admitting: General Surgery

## 2018-08-18 VITALS — BP 130/72 | HR 74 | Resp 12 | Ht 63.0 in | Wt 150.0 lb

## 2018-08-18 DIAGNOSIS — Z1211 Encounter for screening for malignant neoplasm of colon: Secondary | ICD-10-CM | POA: Diagnosis not present

## 2018-08-18 MED ORDER — POLYETHYLENE GLYCOL 3350 17 GM/SCOOP PO POWD: ORAL | 0 refills | Status: DC

## 2018-08-18 NOTE — Progress Notes (Signed)
Patient ID: Brandy Hamilton, female   DOB: [birth year], 62 y.o.   MRN: [id number]  Chief Complaint  Patient presents with  . Colonoscopy    HPI Brandy Hamilton is a 62 y.o. female here today for a colonoscopy evalaution. Last colonoscopy was on 07/27/2008. Patient states no GI problems at this time. Moves her bowels daily.  HPI  Past Medical History:  Diagnosis Date  . Allergy    pollen, seasonal, perfumes, medications  . Asthma   . Breast cancer ([facility]) 2008   radiation  . Cancer ([facility]) 11/07/2007   left partial mastectomy done 12/12/2007-DCIS ER/PR neg,positive margins identified to re-excisione done   . DCIS (ductal carcinoma in situ) of breast    partial mastectomy and RT-----------------Dr Javan Gonzaga  . Hyperlipidemia   . Hypertension    1998  . Personal history of tobacco use, presenting hazards to health   . Special screening for malignant neoplasms, colon     Past Surgical History:  Procedure Laterality Date  . BELPHAROPTOSIS REPAIR Bilateral 01/2017  . BREAST BIOPSY Left    1998    Breast biopsy negative - Eli Pattillo, 3/00     Left breast biopsy- stereotactic10/05   Left breast biopsy - (-) [facility]11/08   Left breast biopsy--L partial mastectomy for DCIS, ,   . BREAST EXCISIONAL BIOPSY Left 2008   positive  . BREAST SURGERY Left 11/07/2007   3 cm area of DCIS, resected the negative margins. PR: 0%; ER less than 5%. Treated by whole breast radiation, patient declined antiestrogen therapy.  . COLONOSCOPY  2009   hyperplastic polyp from rectum, Dr. Bary Castilla    Family History  Problem Relation Age of Onset  . Cancer Maternal Grandfather   . Cancer Other        breast cancer  . Heart disease Mother   . Hypertension Mother   . Breast cancer Neg Hx     Social History Social History   Tobacco Use  . Smoking status: Current Some Day Smoker    Packs/day: 0.25    Years: 5.00    Pack years: 1.25    Types: Cigarettes  . Smokeless tobacco: Never Used  . Tobacco comment:  discussed cold [place] stopping since not an everyday smoker  Substance Use Topics  . Alcohol use: Yes    Comment: drinks rarely  . Drug use: No    Allergies  Allergen Reactions  . Beclomethasone Dipropionate Other (See Comments)    Patient unsure of reaction.  . Levofloxacin Other (See Comments)    Patient unsure of reaction.   . Ramipril     REACTION: Cough    Current Outpatient Medications  Medication Sig Dispense Refill  . amLODipine (NORVASC) 10 MG tablet Take 1 tablet (10 mg total) by mouth daily. 90 tablet 1  . bisoprolol-hydrochlorothiazide (ZIAC) 10-6.25 MG tablet Take 1 tablet by mouth daily. 90 tablet 1  . Calcium Carbonate (CALCIUM 500 PO) Take by mouth daily.      . fish oil-omega-3 fatty acids 1000 MG capsule Take 2 g by mouth daily.      Marland Kitchen levalbuterol (XOPENEX HFA) 45 MCG/ACT inhaler Inhale 1-2 puffs into the lungs every 6 (six) hours as needed.    Marland Kitchen levocetirizine (XYZAL) 5 MG tablet TAKE 1 TABLET BY MOUTH  EVERY EVENING 90 tablet 3  . losartan (COZAAR) 100 MG tablet Take 1 tablet (100 mg total) by mouth daily. 90 tablet 1  . montelukast (SINGULAIR) 10 MG tablet Take 1 tablet (10  mg total) by mouth at bedtime. 90 tablet 1  . Multiple Vitamins-Iron (STRESS FORMULA/IRON) TABS Take 1 tablet by mouth daily.    . Potassium 75 MG TABS Take by mouth daily.      Marland Kitchen rOPINIRole (REQUIP) 0.5 MG tablet Take 1 tablet (0.5 mg total) by mouth at bedtime. 90 tablet 1  . polyethylene glycol powder (GLYCOLAX/MIRALAX) powder 255 grams one bottle for colonoscopy prep 255 g 0   No current facility-administered medications for this visit.     Review of Systems Review of Systems  Constitutional: Negative.   Respiratory: Negative.   Cardiovascular: Negative.     Blood pressure 130/72, pulse 74, resp. rate 12, height 5\' 3"  (1.6 m), weight 150 lb (68 kg).  Physical Exam Physical Exam  Constitutional: She is oriented to person, place, and time. She appears well-developed and  well-nourished.  Cardiovascular: Normal rate, regular rhythm and normal heart sounds.  Pulmonary/Chest: Effort normal and breath sounds normal.  Neurological: She is alert and oriented to person, place, and time.  Skin: Skin is warm and dry.    Data Reviewed Screening mammogram of August 06, 2018 showed 3 small sessile polyps in the rectosigmoid.  A few small mouth diverticuli were noted in the sigmoid colon.  Pathology showed hyperplastic polyps.  Assessment    Candidate for screening colonoscopy.    Plan  Options for appropriate screening were reviewed: 1) colonoscopy versus 2) Cologuard.  Pros and cons of each reviewed.  Colonoscopy with possible biopsy/polypectomy prn: Information regarding the procedure, including its potential risks and complications (including but not limited to perforation of the bowel, which may require emergency surgery to repair, and bleeding) was verbally given to the patient. Educational information regarding lower intestinal endoscopy was given to the patient. Written instructions for how to complete the bowel prep using Miralax were provided. The importance of drinking ample fluids to avoid dehydration as a result of the prep emphasized.   HPI, Physical Exam, Assessment and Plan have been scribed under the direction and in the presence of Hervey Ard, MD.  Gaspar Cola, CMA  I have completed the exam and reviewed the above documentation for accuracy and completeness.  I agree with the above.  Haematologist has been used and any errors in dictation or transcription are unintentional.  Hervey Ard, M.D., F.A.C.S.  [name] Uma Jerde 08/18/2018, 9:14 PM  Patient has been scheduled for a colonoscopy on 08-31-18 at [facility]. Miralax prescription has been sent in to the patient's pharmacy today. Colonoscopy instructions have been reviewed with the patient. In addition to his regular Miralax prep patient has been asked to take Dulcolax 10 mg morning  and afternoon two This patient is aware to call the office if they have further questions.   Dominga [place]erry, CMA

## 2018-08-18 NOTE — Patient Instructions (Signed)
Colonoscopy, Adult A colonoscopy is an exam to look at the entire large intestine. During the exam, a lubricated, bendable tube is inserted into the anus and then passed into the rectum, colon, and other parts of the large intestine. A colonoscopy is often done as a part of normal colorectal screening or in response to certain symptoms, such as anemia, persistent diarrhea, abdominal pain, and blood in the stool. The exam can help screen for and diagnose medical problems, including:  Tumors.  Polyps.  Inflammation.  Areas of bleeding.  Tell a health care provider about:  Any allergies you have.  All medicines you are taking, including vitamins, herbs, eye drops, creams, and over-the-counter medicines.  Any problems you or family members have had with anesthetic medicines.  Any blood disorders you have.  Any surgeries you have had.  Any medical conditions you have.  Any problems you have had passing stool. What are the risks? Generally, this is a safe procedure. However, problems may occur, including:  Bleeding.  A tear in the intestine.  A reaction to medicines given during the exam.  Infection (rare).  What happens before the procedure? Eating and drinking restrictions Follow instructions from your health care provider about eating and drinking, which may include:  A few days before the procedure - follow a low-fiber diet. Avoid nuts, seeds, dried fruit, raw fruits, and vegetables.  1-3 days before the procedure - follow a clear liquid diet. Drink only clear liquids, such as clear broth or bouillon, black coffee or tea, clear juice, clear soft drinks or sports drinks, gelatin dessert, and popsicles. Avoid any liquids that contain red or purple dye.  On the day of the procedure - do not eat or drink anything during the 2 hours before the procedure, or within the time period that your health care provider recommends.  Bowel prep If you were prescribed an oral bowel prep  to clean out your colon:  Take it as told by your health care provider. Starting the day before your procedure, you will need to drink a large amount of medicated liquid. The liquid will cause you to have multiple loose stools until your stool is almost clear or light green.  If your skin or anus gets irritated from diarrhea, you may use these to relieve the irritation: ? Medicated wipes, such as adult wet wipes with aloe and vitamin E. ? A skin soothing-product like petroleum jelly.  If you vomit while drinking the bowel prep, take a break for up to 60 minutes and then begin the bowel prep again. If vomiting continues and you cannot take the bowel prep without vomiting, call your health care provider.  General instructions  Ask your health care provider about changing or stopping your regular medicines. This is especially important if you are taking diabetes medicines or blood thinners.  Plan to have someone take you home from the hospital or clinic. What happens during the procedure?  An IV tube may be inserted into one of your veins.  You will be given medicine to help you relax (sedative).  To reduce your risk of infection: ? Your health care team will wash or sanitize their hands. ? Your anal area will be washed with soap.  You will be asked to lie on your side with your knees bent.  Your health care provider will lubricate a long, thin, flexible tube. The tube will have a camera and a light on the end.  The tube will be inserted into your   anus.  The tube will be gently eased through your rectum and colon.  Air will be delivered into your colon to keep it open. You may feel some pressure or cramping.  The camera will be used to take images during the procedure.  A small tissue sample may be removed from your body to be examined under a microscope (biopsy). If any potential problems are found, the tissue will be sent to a lab for testing.  If small polyps are found, your  health care provider may remove them and have them checked for cancer cells.  The tube that was inserted into your anus will be slowly removed. The procedure may vary among health care providers and hospitals. What happens after the procedure?  Your blood pressure, heart rate, breathing rate, and blood oxygen level will be monitored until the medicines you were given have worn off.  Do not drive for 24 hours after the exam.  You may have a small amount of blood in your stool.  You may pass gas and have mild abdominal cramping or bloating due to the air that was used to inflate your colon during the exam.  It is up to you to get the results of your procedure. Ask your health care provider, or the department performing the procedure, when your results will be ready. This information is not intended to replace advice given to you by your health care provider. Make sure you discuss any questions you have with your health care provider. Document Released: 12/04/2000 Document Revised: 10/07/2016 Document Reviewed: 02/18/2016 Elsevier Interactive Patient Education  2018 Elsevier Inc.  

## 2018-08-29 ENCOUNTER — Encounter: Payer: Self-pay | Admitting: Internal Medicine

## 2018-08-29 ENCOUNTER — Ambulatory Visit (INDEPENDENT_AMBULATORY_CARE_PROVIDER_SITE_OTHER): Payer: Managed Care, Other (non HMO) | Admitting: Internal Medicine

## 2018-08-29 VITALS — BP 110/80 | HR 59 | Temp 97.8°F | Ht 62.25 in | Wt 153.0 lb

## 2018-08-29 DIAGNOSIS — J4520 Mild intermittent asthma, uncomplicated: Secondary | ICD-10-CM

## 2018-08-29 DIAGNOSIS — I10 Essential (primary) hypertension: Secondary | ICD-10-CM | POA: Diagnosis not present

## 2018-08-29 DIAGNOSIS — Z0000 Encounter for general adult medical examination without abnormal findings: Secondary | ICD-10-CM | POA: Diagnosis not present

## 2018-08-29 DIAGNOSIS — Z0001 Encounter for general adult medical examination with abnormal findings: Secondary | ICD-10-CM | POA: Insufficient documentation

## 2018-08-29 MED ORDER — ROPINIROLE HCL 0.5 MG PO TABS: 0.5000 mg | ORAL_TABLET | Freq: Every day | ORAL | 3 refills | Status: DC

## 2018-08-29 MED ORDER — LOSARTAN POTASSIUM 100 MG PO TABS: 100.0000 mg | ORAL_TABLET | Freq: Every day | ORAL | 3 refills | Status: DC

## 2018-08-29 MED ORDER — BISOPROLOL-HYDROCHLOROTHIAZIDE 10-6.25 MG PO TABS: 1.0000 | ORAL_TABLET | Freq: Every day | ORAL | 3 refills | Status: DC

## 2018-08-29 MED ORDER — LEVALBUTEROL TARTRATE 45 MCG/ACT IN AERO: 1.0000 | INHALATION_SPRAY | Freq: Four times a day (QID) | RESPIRATORY_TRACT | 1 refills | Status: DC | PRN

## 2018-08-29 MED ORDER — MONTELUKAST SODIUM 10 MG PO TABS: 10.0000 mg | ORAL_TABLET | Freq: Every day | ORAL | 3 refills | Status: DC

## 2018-08-29 MED ORDER — AMLODIPINE BESYLATE 10 MG PO TABS: 10.0000 mg | ORAL_TABLET | Freq: Every day | ORAL | 3 refills | Status: DC

## 2018-08-29 NOTE — Assessment & Plan Note (Signed)
BP Readings from Last 3 Encounters:  08/29/18 110/80  08/18/18 130/72  09/13/17 120/80   Good control Due for labs

## 2018-08-29 NOTE — Assessment & Plan Note (Signed)
Okay on the montelukast

## 2018-08-29 NOTE — Progress Notes (Signed)
Subjective:    Patient ID: Brandy Hamilton, female    DOB: [birth year], 62 y.o.   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 amLODipine (NORVASC) 10 MG tablet Take 1 tablet (10 mg total) by mouth daily. 90 tablet 1  . bisoprolol-hydrochlorothiazide (ZIAC) 10-6.25 MG tablet Take 1 tablet by mouth daily. 90 tablet 1  . Calcium Carbonate (CALCIUM 500 PO) Take by mouth daily.      . fish oil-omega-3 fatty acids 1000 MG capsule Take 2 g by mouth daily.      Marland Kitchen levalbuterol (XOPENEX HFA) 45 MCG/ACT inhaler Inhale 1-2 puffs into the lungs every 6 (six) hours as needed.    Marland Kitchen levocetirizine (XYZAL) 5 MG tablet TAKE 1 TABLET BY MOUTH  EVERY EVENING 90 tablet 3  . losartan (COZAAR) 100 MG tablet Take 1 tablet (100 mg total) by mouth daily. 90 tablet 1  . montelukast (SINGULAIR) 10 MG tablet Take 1 tablet (10 mg total) by mouth at bedtime. 90 tablet 1  . polyethylene glycol powder (GLYCOLAX/MIRALAX) powder 255 grams one bottle for colonoscopy prep 255 g 0  . Potassium 75 MG TABS Take by mouth daily.      Marland Kitchen rOPINIRole (REQUIP) 0.5 MG tablet Take 1 tablet (0.5 mg total) by mouth at bedtime. 90 tablet 1   No current facility-administered medications on file prior to visit.     Allergies  Allergen Reactions  . Beclomethasone Dipropionate Other (See Comments)    Patient unsure of reaction.  . Levofloxacin Other (See Comments)    Patient unsure of reaction.   . Ramipril     REACTION: Cough    Past Medical History:  Diagnosis Date  . Allergy    pollen, seasonal, perfumes, medications  . Asthma   . Breast cancer ([place]) 2008   radiation  . Cancer ([place])  11/07/2007   left partial mastectomy done 12/12/2007-DCIS ER/PR neg,positive margins identified to re-excisione done   . DCIS (ductal carcinoma in situ) of breast    partial mastectomy and RT-----------------Dr Byrnett  . Hyperlipidemia   . Hypertension    1998  . Personal history of tobacco use, presenting hazards to health   . Special screening for malignant neoplasms, colon     Past Surgical History:  Procedure Laterality Date  . BELPHAROPTOSIS REPAIR Bilateral 01/2017  . BREAST BIOPSY Left    1998    Breast biopsy negative - Byrnett, 3/00     Left breast biopsy- stereotactic10/05   Left breast biopsy - (-) Byrnett11/08   Left breast biopsy--L partial mastectomy for DCIS, ,   . BREAST EXCISIONAL BIOPSY Left 2008   positive  . BREAST SURGERY Left 11/07/2007   3 cm area of DCIS, resected the negative margins. PR: 0%; ER less than 5%. Treated by whole breast radiation, patient declined antiestrogen therapy.  . COLONOSCOPY  2009   hyperplastic polyp from rectum, Dr. Bary Castilla    Family History  Problem Relation Age of Onset  . Cancer Maternal Grandfather   . Cancer Other        breast cancer  . Heart disease Mother   .  Hypertension Mother   . Breast cancer Neg Hx     Social History   Socioeconomic History  . Marital status: Divorced    Spouse name: Not on file  . Number of children: 0  . Years of education: Not on file  . Highest education level: Not on file  Occupational History  . 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 Occupation: Bookkeeping, taxes---part time    Comment:    . Occupation: Rental properties  . Occupation:    Social Needs  . Financial resource strain: Not on file  . Food insecurity:    Worry: Not on file    Inability: Not on file  . Transportation needs:    Medical: Not on file    Non-medical: Not on file  Tobacco Use  . Smoking status: Current Some Day Smoker    Packs/day: 0.25    Years: 5.00    Pack years:  1.25    Types: Cigarettes  . Smokeless tobacco: Never Used  . Tobacco comment: discussed cold [place] stopping since not an everyday smoker  Substance and Sexual Activity  . Alcohol use: Yes    Comment: drinks rarely  . Drug use: No  . Sexual activity: Not on file  Lifestyle  . Physical activity:    Days per week: Not on file    Minutes per session: Not on file  . Stress: Not on file  Relationships  . Social connections:    Talks on phone: Not on file    Gets together: Not on file    Attends religious service: Not on file    Active member of club or organization: Not on file    Attends meetings of clubs or organizations: Not on file    Relationship status: Not on file  . Intimate partner violence:    Fear of current or ex partner: Not on file    Emotionally abused: Not on file    Physically abused: Not on file    Forced sexual activity: Not on file  Other Topics Concern  . Not on file  Social History Narrative  . Not on file   Review of Systems  Constitutional: Negative for fatigue and unexpected weight change.       No regular exercise Wears seat belt usually Only occasionally has a cigarette--- counseled  HENT: Negative for hearing loss, tinnitus and trouble swallowing.        Keeps up with dentist  Eyes: Negative for visual disturbance.       No diplopia or unilateral vision loss  Respiratory: Negative for shortness of breath and wheezing.        Some cough---relates to asthma  Cardiovascular: Negative for chest pain, palpitations and leg swelling.  Gastrointestinal: Negative for blood in stool and constipation.       Rare heartburn--tums helps  Endocrine: Negative for polydipsia and polyuria.  Genitourinary: Negative for dyspareunia, dysuria and hematuria.  Musculoskeletal: Negative for arthralgias, back pain and joint swelling.  Skin: Negative for rash.  Allergic/Immunologic: Positive for environmental allergies. Negative for immunocompromised state.    Neurological: Negative for dizziness, syncope and light-headedness.       Occ mild headaches  Hematological: Negative for adenopathy. Does not bruise/bleed easily.  Psychiatric/Behavioral: Negative for dysphoric mood. The patient is not nervous/anxious.        Not a great sleeper---naps if needed       Objective:   Physical Exam  Constitutional: She is  oriented to person, place, and time. She appears well-developed. No distress.  HENT:  Head: Normocephalic and atraumatic.  Right Ear: External ear normal.  Left Ear: External ear normal.  Mouth/Throat: Oropharynx is clear and moist. No oropharyngeal exudate.  Eyes: Pupils are equal, round, and reactive to light. Conjunctivae are normal.  Neck: No thyromegaly present.  Cardiovascular: Normal rate, regular rhythm, normal heart sounds and intact distal pulses. Exam reveals no gallop.  No murmur heard. Respiratory: Effort normal and breath sounds normal. No respiratory distress. She has no wheezes. She has no rales.  GI: Soft. There is no tenderness.  Musculoskeletal: She exhibits no edema or tenderness.  Lymphadenopathy:    She has no cervical adenopathy.  Neurological: She is alert and oriented to person, place, and time.  Skin: No rash noted. No erythema.  Psychiatric: She has a normal mood and affect. Her behavior is normal.           Assessment & Plan:

## 2018-08-29 NOTE — Assessment & Plan Note (Signed)
Healthy Discussed fitness Getting colonoscopy Yearly mammogram--due October Pap due 2021 [place] shot from county

## 2018-08-30 ENCOUNTER — Other Ambulatory Visit: Payer: Self-pay | Admitting: Internal Medicine

## 2018-08-30 ENCOUNTER — Encounter: Payer: Self-pay | Admitting: *Deleted

## 2018-08-30 DIAGNOSIS — Z1231 Encounter for screening mammogram for malignant neoplasm of breast: Secondary | ICD-10-CM

## 2018-08-30 LAB — COMPREHENSIVE METABOLIC PANEL
ALK PHOS: 83 U/L (ref 39–117)
ALT: 19 U/L (ref 0–35)
AST: 16 U/L (ref 0–37)
Albumin: 4.1 g/dL (ref 3.5–5.2)
BILIRUBIN TOTAL: 0.4 mg/dL (ref 0.2–1.2)
BUN: 20 mg/dL (ref 6–23)
CO2: 28 mEq/L (ref 19–32)
CREATININE: 0.96 mg/dL (ref 0.40–1.20)
Calcium: 9.9 mg/dL (ref 8.4–10.5)
Chloride: 105 mEq/L (ref 96–112)
GFR: 62.98 mL/min (ref >60.00)
Glucose, Bld: 90 mg/dL (ref 70–99)
Potassium: 4 mEq/L (ref 3.5–5.1)
SODIUM: 142 meq/L (ref 135–145)
TOTAL PROTEIN: 7 g/dL (ref 6.0–8.3)

## 2018-08-30 LAB — CBC
HCT: 42 % (ref 36.0–46.0)
Hemoglobin: 14.5 g/dL (ref 12.0–15.0)
MCHC: 34.6 g/dL (ref 30.0–36.0)
MCV: 97.7 fl (ref 78.0–100.0)
Platelets: 315 10*3/uL (ref 150.0–400.0)
RBC: 4.3 Mil/uL (ref 3.87–5.11)
RDW: 12.7 % (ref 11.5–15.5)
WBC: 10.7 10*3/uL — ABNORMAL HIGH (ref 4.0–10.5)

## 2018-08-31 ENCOUNTER — Encounter
Admission: RE | Disposition: A | Discharge status: Home / Self Care | Payer: Self-pay | Source: Ambulatory Visit | Attending: General Surgery

## 2018-08-31 ENCOUNTER — Encounter: Payer: Self-pay | Admitting: Anesthesiology

## 2018-08-31 ENCOUNTER — Ambulatory Visit
Admission: RE | Admit: 2018-08-31 | Discharge: 2018-08-31 | Disposition: A | Discharge status: Home / Self Care | Payer: Managed Care, Other (non HMO) | Source: Ambulatory Visit | Attending: General Surgery | Admitting: General Surgery

## 2018-08-31 ENCOUNTER — Ambulatory Visit: Payer: Managed Care, Other (non HMO) | Admitting: Anesthesiology

## 2018-08-31 DIAGNOSIS — Z923 Personal history of irradiation: Secondary | ICD-10-CM | POA: Diagnosis not present

## 2018-08-31 DIAGNOSIS — K5730 Diverticulosis of large intestine without perforation or abscess without bleeding: Secondary | ICD-10-CM | POA: Insufficient documentation

## 2018-08-31 DIAGNOSIS — Z853 Personal history of malignant neoplasm of breast: Secondary | ICD-10-CM | POA: Diagnosis not present

## 2018-08-31 DIAGNOSIS — E785 Hyperlipidemia, unspecified: Secondary | ICD-10-CM | POA: Diagnosis not present

## 2018-08-31 DIAGNOSIS — F17210 Nicotine dependence, cigarettes, uncomplicated: Secondary | ICD-10-CM | POA: Diagnosis not present

## 2018-08-31 DIAGNOSIS — D120 Benign neoplasm of cecum: Secondary | ICD-10-CM | POA: Insufficient documentation

## 2018-08-31 DIAGNOSIS — Z881 Allergy status to other antibiotic agents status: Secondary | ICD-10-CM | POA: Diagnosis not present

## 2018-08-31 DIAGNOSIS — Z79899 Other long term (current) drug therapy: Secondary | ICD-10-CM | POA: Insufficient documentation

## 2018-08-31 DIAGNOSIS — J449 Chronic obstructive pulmonary disease, unspecified: Secondary | ICD-10-CM | POA: Insufficient documentation

## 2018-08-31 DIAGNOSIS — K621 Rectal polyp: Secondary | ICD-10-CM | POA: Diagnosis not present

## 2018-08-31 DIAGNOSIS — D123 Benign neoplasm of transverse colon: Secondary | ICD-10-CM

## 2018-08-31 DIAGNOSIS — I10 Essential (primary) hypertension: Secondary | ICD-10-CM | POA: Diagnosis not present

## 2018-08-31 DIAGNOSIS — Z1211 Encounter for screening for malignant neoplasm of colon: Secondary | ICD-10-CM | POA: Diagnosis not present

## 2018-08-31 DIAGNOSIS — Z888 Allergy status to other drugs, medicaments and biological substances status: Secondary | ICD-10-CM | POA: Diagnosis not present

## 2018-08-31 HISTORY — PX: COLONOSCOPY WITH PROPOFOL: SHX5780

## 2018-08-31 SURGERY — COLONOSCOPY WITH PROPOFOL
Anesthesia: General

## 2018-08-31 MED ORDER — PROPOFOL 10 MG/ML IV BOLUS
INTRAVENOUS | Status: DC | PRN
  Administered 2018-08-31: 30 mg via INTRAVENOUS
  Administered 2018-08-31: 70 mg via INTRAVENOUS

## 2018-08-31 MED ORDER — LIDOCAINE HCL (PF) 1 % IJ SOLN
2.0000 mL | Freq: Once | INTRAMUSCULAR | Status: AC
  Administered 2018-08-31: 0.3 mL via INTRADERMAL

## 2018-08-31 MED ORDER — LIDOCAINE HCL (PF) 1 % IJ SOLN
INTRAMUSCULAR | Status: AC
  Administered 2018-08-31: 0.3 mL via INTRADERMAL
  Filled 2018-08-31: qty 2

## 2018-08-31 MED ORDER — LIDOCAINE 2% (20 MG/ML) 5 ML SYRINGE
INTRAMUSCULAR | Status: DC | PRN
  Administered 2018-08-31: 50 mg via INTRAVENOUS

## 2018-08-31 MED ORDER — PROPOFOL 500 MG/50ML IV EMUL
INTRAVENOUS | Status: DC | PRN
  Administered 2018-08-31: 150 ug/kg/min via INTRAVENOUS

## 2018-08-31 MED ORDER — SODIUM CHLORIDE 0.9 % IV SOLN
INTRAVENOUS | Status: DC
  Administered 2018-08-31: 1000 mL via INTRAVENOUS

## 2018-08-31 MED ORDER — PROPOFOL 500 MG/50ML IV EMUL
INTRAVENOUS | Status: AC
  Filled 2018-08-31: qty 50

## 2018-08-31 NOTE — Anesthesia Postprocedure Evaluation (Signed)
Anesthesia Post Note  Patient: Brandy Hamilton  Procedure(s) Performed: COLONOSCOPY WITH PROPOFOL (N/A )  Patient location during evaluation: Endoscopy Anesthesia Type: General Level of consciousness: awake and alert Pain management: pain level controlled Vital Signs Assessment: post-procedure vital signs reviewed and stable Respiratory status: spontaneous breathing, nonlabored ventilation, respiratory function stable and patient connected to nasal cannula oxygen Cardiovascular status: blood pressure returned to baseline and stable Postop Assessment: no apparent nausea or vomiting Anesthetic complications: no Comments: Concern for Afib flutter on post op rhythm strip.  Post op ECG shows no Afib flutter.  Patient asymptomatic as well, no SOB, no chest pain no dizziness.     Last Vitals:  Vitals:   08/31/18 1150 08/31/18 1200  BP: (!) 153/70 (!) 158/91  Pulse: (!) 47 (!) 47  Resp: 12 11  Temp:    SpO2: 100% 100%    Last Pain:  Vitals:   08/31/18 1130  TempSrc: Tympanic                 Precious Haws Taiki Buckwalter

## 2018-08-31 NOTE — Anesthesia Postprocedure Evaluation (Deleted)
Anesthesia Post Note  Patient: Brandy Hamilton  Procedure(s) Performed: COLONOSCOPY WITH PROPOFOL (N/A )  Patient location during evaluation: Endoscopy Anesthesia Type: General Level of consciousness: awake and alert Pain management: pain level controlled Vital Signs Assessment: post-procedure vital signs reviewed and stable Respiratory status: spontaneous breathing, nonlabored ventilation, respiratory function stable and patient connected to nasal cannula oxygen Cardiovascular status: blood pressure returned to baseline and stable Postop Assessment: no apparent nausea or vomiting Anesthetic complications: no     Last Vitals:  Vitals:   08/31/18 1150 08/31/18 1200  BP: (!) 153/70 (!) 158/91  Pulse: (!) 47 (!) 47  Resp: 12 11  Temp:    SpO2: 100% 100%    Last Pain:  Vitals:   08/31/18 1130  TempSrc: Tympanic                 Precious Haws Fae Blossom

## 2018-08-31 NOTE — Anesthesia Post-op Follow-up Note (Signed)
Anesthesia QCDR form completed.        

## 2018-08-31 NOTE — Anesthesia Preprocedure Evaluation (Signed)
Anesthesia Evaluation  Patient identified by MRN, date of birth, ID band Patient awake    Reviewed: Allergy & Precautions, H&P , NPO status , Patient's Chart, lab work & pertinent test results  History of Anesthesia Complications Negative for: history of anesthetic complications  Airway Mallampati: III  TM Distance: >3 FB Neck ROM: full    Dental  (+) Chipped, Poor Dentition, Missing   Pulmonary shortness of breath and with exertion, asthma , COPD, Current Smoker,           Cardiovascular Exercise Tolerance: Good hypertension, (-) angina(-) Past MI and (-) DOE      Neuro/Psych negative neurological ROS  negative psych ROS   GI/Hepatic negative GI ROS, Neg liver ROS,   Endo/Other  negative endocrine ROS  Renal/GU negative Renal ROS  negative genitourinary   Musculoskeletal   Abdominal   Peds  Hematology negative hematology ROS (+)   Anesthesia Other Findings Past Medical History: No date: Allergy     Comment:  pollen, seasonal, perfumes, medications No date: Asthma 2008: Breast cancer ([place])     Comment:  radiation 11/07/2007: Cancer ([place])     Comment:  left partial mastectomy done 12/12/2007-DCIS ER/PR               neg,positive margins identified to re-excisione done  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     Comment:  partial mastectomy and RT-----------------Dr Byrnett No date: Hyperlipidemia No date: Hypertension     Comment:  1998 No date: Personal history of tobacco use, presenting hazards to health No date: Special screening for malignant neoplasms, colon  Past Surgical History: 01/2017: BELPHAROPTOSIS REPAIR; Bilateral No date: BREAST BIOPSY; Left     Comment:  1998    Breast biopsy negative - Byrnett, 3/00     Left               breast biopsy- stereotactic10/05   Left breast biopsy -               (-) Byrnett11/08   Left breast biopsy--L partial               mastectomy for DCIS, ,   2008: BREAST EXCISIONAL BIOPSY; Left     Comment:  positive 11/07/2007: BREAST SURGERY; Left     Comment:  3 cm area of DCIS, resected the negative margins. PR:               0%; ER less than 5%. Treated by whole breast radiation,               patient declined antiestrogen therapy. 2009: COLONOSCOPY     Comment:  hyperplastic polyp from rectum, Dr. Bary Castilla  BMI    Body Mass Index:  27.98 kg/m      Reproductive/Obstetrics negative OB ROS                             Anesthesia Physical Anesthesia Plan  ASA: III  Anesthesia Plan: General   Post-op Pain Management:    Induction: Intravenous  PONV Risk Score and Plan: Propofol infusion and TIVA  Airway Management Planned: Natural Airway and Nasal Cannula  Additional Equipment:   Intra-op Plan:   Post-operative Plan:   Informed Consent: I have reviewed the patients History and Physical, chart, labs and discussed the procedure including the risks, benefits and alternatives for the proposed anesthesia with the patient or authorized representative who has  indicated his/her understanding and acceptance.   Dental Advisory Given  Plan Discussed with: Anesthesiologist, CRNA and Surgeon  Anesthesia Plan Comments: (Patient consented for risks of anesthesia including but not limited to:  - adverse reactions to medications - risk of intubation if required - damage to teeth, lips or other oral mucosa - sore throat or hoarseness - Damage to heart, brain, lungs or loss of life  Patient voiced understanding.)        Anesthesia Quick Evaluation

## 2018-08-31 NOTE — Transfer of Care (Signed)
Immediate Anesthesia Transfer of Care Note  Patient: Brandy Hamilton  Procedure(s) Performed: COLONOSCOPY WITH PROPOFOL (N/A )  Patient Location: Endoscopy Unit  Anesthesia Type:General  Level of Consciousness: awake, alert  and oriented  Airway & Oxygen Therapy: Patient Spontanous Breathing  Post-op Assessment: Post -op Vital signs reviewed and stable  Post vital signs: stable  Last Vitals:  Vitals Value Taken Time  BP 106/71 08/31/2018 11:33 AM  Temp    Pulse 57 08/31/2018 11:34 AM  Resp 20 08/31/2018 11:34 AM  SpO2 97 % 08/31/2018 11:34 AM  Vitals shown include unvalidated device data.  Last Pain:  Vitals:   08/31/18 1030  TempSrc: Tympanic         Complications: No apparent anesthesia complications

## 2018-08-31 NOTE — H&P (Signed)
Brandy Hamilton [id number] 18-Oct-1956     HPI: 62 year old woman for screening colonoscopy.  No interval change in clinical history since her visit in the office 2 weeks ago.  Tolerated prep well.  Medications Prior to Admission  Medication Sig Dispense Refill Last Dose  . amLODipine (NORVASC) 10 MG tablet Take 1 tablet (10 mg total) by mouth daily. 90 tablet 3   . bisoprolol-hydrochlorothiazide (ZIAC) 10-6.25 MG tablet Take 1 tablet by mouth daily. 90 tablet 3   . Calcium Carbonate (CALCIUM 500 PO) Take by mouth daily.     Taking  . fish oil-omega-3 fatty acids 1000 MG capsule Take 2 g by mouth daily.     Taking  . levalbuterol (XOPENEX HFA) 45 MCG/ACT inhaler Inhale 1-2 puffs into the lungs every 6 (six) hours as needed. 1 Inhaler 1   . levocetirizine (XYZAL) 5 MG tablet TAKE 1 TABLET BY MOUTH  EVERY EVENING 90 tablet 3 Taking  . losartan (COZAAR) 100 MG tablet Take 1 tablet (100 mg total) by mouth daily. 90 tablet 3   . montelukast (SINGULAIR) 10 MG tablet Take 1 tablet (10 mg total) by mouth at bedtime. 90 tablet 3 08/31/2018 at 0730  . polyethylene glycol powder (GLYCOLAX/MIRALAX) powder 255 grams one bottle for colonoscopy prep 255 g 0 Taking  . Potassium 75 MG TABS Take by mouth daily.     Taking  . rOPINIRole (REQUIP) 0.5 MG tablet Take 1 tablet (0.5 mg total) by mouth at bedtime. 90 tablet 3    Allergies  Allergen Reactions  . Beclomethasone Dipropionate Other (See Comments)    Patient unsure of reaction.  . Levofloxacin Other (See Comments)    Patient unsure of reaction.   . Ramipril     REACTION: Cough   Past Medical History:  Diagnosis Date  . Allergy    pollen, seasonal, perfumes, medications  . Asthma   . Breast cancer ([place]) 2008   radiation  . Cancer ([place]) 11/07/2007   left partial mastectomy done 12/12/2007-DCIS ER/PR neg,positive margins identified to re-excisione done   . DCIS (ductal carcinoma in situ) of breast    partial mastectomy and RT-----------------Dr  Gerald Honea  . Hyperlipidemia   . Hypertension    1998  . Personal history of tobacco use, presenting hazards to health   . Special screening for malignant neoplasms, colon    Past Surgical History:  Procedure Laterality Date  . BELPHAROPTOSIS REPAIR Bilateral 01/2017  . BREAST BIOPSY Left    1998    Breast biopsy negative - Roe Wilner, 3/00     Left breast biopsy- stereotactic10/05   Left breast biopsy - (-) Byrnett11/08   Left breast biopsy--L partial mastectomy for DCIS, ,   . BREAST EXCISIONAL BIOPSY Left 2008   positive  . BREAST SURGERY Left 11/07/2007   3 cm area of DCIS, resected the negative margins. PR: 0%; ER less than 5%. Treated by whole breast radiation, patient declined antiestrogen therapy.  . COLONOSCOPY  2009   hyperplastic polyp from rectum, Dr. Bary Castilla   Social History   Socioeconomic History  . Marital status: Divorced    Spouse name: Not on file  . Number of children: 0  . Years of education: Not on file  . Highest education level: Not on file  Occupational History  . 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 Occupation: Bookkeeping, taxes---part time    Comment:    . Occupation: Rental properties  . Occupation:  Social Needs  . Financial resource strain: Not on file  . Food insecurity:    Worry: Not on file    Inability: Not on file  . Transportation needs:    Medical: Not on file    Non-medical: Not on file  Tobacco Use  . Smoking status: Current Some Day Smoker    Packs/day: 0.25    Years: 5.00    Pack years: 1.25    Types: Cigarettes  . Smokeless tobacco: Never Used  . Tobacco comment: discussed cold [place] stopping since not an everyday smoker  Substance and Sexual Activity  . Alcohol use: Yes    Comment: drinks rarely  . Drug use: No  . Sexual activity: Not on file  Lifestyle  . Physical activity:    Days per week: Not on file    Minutes per session: Not on file  . Stress: Not on file  Relationships  .  Social connections:    Talks on phone: Not on file    Gets together: Not on file    Attends religious service: Not on file    Active member of club or organization: Not on file    Attends meetings of clubs or organizations: Not on file    Relationship status: Not on file  . Intimate partner violence:    Fear of current or ex partner: Not on file    Emotionally abused: Not on file    Physically abused: Not on file    Forced sexual activity: Not on file  Other Topics Concern  . Not on file  Social History Narrative  . Not on file   Social History   Social History Narrative  . Not on file     ROS: Negative.     PE: HEENT: Negative. Lungs: Clear. Cardio: RR.  Assessment/Plan:  Proceed with planned endoscopy.   [name] [facility] 08/31/2018

## 2018-08-31 NOTE — Addendum Note (Signed)
Addendum  created 08/31/18 1259 by Piscitello, Precious Haws, MD   Delete clinical note, Sign clinical note

## 2018-08-31 NOTE — Op Note (Signed)
[facility] Gastroenterology Patient Name: Brandy Hamilton Procedure Date: 08/31/2018 10:46 AM MRN: [id number] Account #: [id number] Date of Birth: December 17, 1956 Admit Type: Outpatient Age: 62 Room: [facility] ENDO ROOM 1 Gender: Female Note Status: Finalized Procedure:            Colonoscopy Indications:          Screening for colorectal malignant neoplasm Providers:            Robert Bellow, MD Referring MD:         Venia Carbon (Referring MD) Medicines:            Monitored Anesthesia Care Complications:        No immediate complications. Procedure:            Pre-Anesthesia Assessment:                       - Prior to the procedure, a History and Physical was                        performed, and patient medications, allergies and                        sensitivities were reviewed. The patient's tolerance of                        previous anesthesia was reviewed.                       - The risks and benefits of the procedure and the                        sedation options and risks were discussed with the                        patient. All questions were answered and informed                        consent was obtained.                       After obtaining informed consent, the colonoscope was                        passed under direct vision. Throughout the procedure,                        the patient's blood pressure, pulse, and oxygen                        saturations were monitored continuously. The                        Colonoscope was introduced through the anus and                        advanced to the the cecum, identified by appendiceal                        orifice and ileocecal valve. The colonoscopy was  performed without difficulty. The patient tolerated the                        procedure well. The quality of the bowel preparation                        was excellent. Findings:      A 10 mm polyp was found in the  cecum. The polyp was sessile. The polyp       was removed with a hot snare. Resection and retrieval were complete.      A 8 mm polyp was found in the hepatic flexure. The polyp was sessile.       The polyp was removed with a hot snare. Resection and retrieval were       complete.      A 7 mm polyp was found in the rectum. The polyp was sessile. The polyp       was removed with a hot snare. Resection and retrieval were complete.      The retroflexed view of the distal rectum and anal verge was normal and       showed no anal or rectal abnormalities.      A few small-mouthed diverticula were found in the sigmoid colon. Impression:           - One 10 mm polyp in the cecum, removed with a hot                        snare. Resected and retrieved.                       - One 8 mm polyp at the hepatic flexure, removed with a                        hot snare. Resected and retrieved.                       - One 7 mm polyp in the rectum, removed with a hot                        snare. Resected and retrieved.                       - The entire examined colon is normal on direct and                        retroflexion views. Recommendation:       - Telephone endoscopist for pathology results in 1 week. Procedure Code(s):    --- Professional ---                       [id number], Colonoscopy, flexible; with removal of tumor(s),                        polyp(s), or other lesion(s) by snare technique Diagnosis Code(s):    --- Professional ---                       Z12.11, Encounter for screening for malignant neoplasm  of colon                       D12.0, Benign neoplasm of cecum                       D12.3, Benign neoplasm of transverse colon (hepatic                        flexure or splenic flexure)                       K62.1, Rectal polyp CPT copyright 2017 American Medical Association. All rights reserved. The codes documented in this report are preliminary and upon coder review  may  be revised to meet current compliance requirements. Robert Bellow, MD 08/31/2018 11:32:34 AM This report has been signed electronically. Number of Addenda: 0 Note Initiated On: 08/31/2018 10:46 AM Scope Withdrawal Time: 0 hours 15 minutes 6 seconds  Total Procedure Duration: 0 hours 27 minutes 50 seconds       [facility]

## 2018-09-01 LAB — SURGICAL PATHOLOGY

## 2018-09-02 ENCOUNTER — Telehealth: Payer: Self-pay | Admitting: *Deleted

## 2018-09-02 NOTE — Telephone Encounter (Signed)
Left message for patient to call the office back for results  

## 2018-09-02 NOTE — Telephone Encounter (Signed)
-----   Message from Robert Bellow, MD sent at 09/01/2018  7:51 PM EDT ----- Please notify the patient that the polyps removed were benign.  We will contact you next week and whether her follow-up should be in 3 or 5 years.  Thank you

## 2018-09-08 ENCOUNTER — Encounter: Payer: Self-pay | Admitting: General Surgery

## 2018-09-08 NOTE — Telephone Encounter (Signed)
-----   Message from Robert Bellow, MD sent at 09/01/2018  7:51 PM EDT ----- Please notify the patient that the polyps removed were benign.  We will contact you next week and whether her follow-up should be in 3 or 5 years.  Thank you

## 2018-09-08 NOTE — Telephone Encounter (Signed)
Notified patient as instructed, patient agrees. Patient was already placed in recalls

## 2018-10-10 ENCOUNTER — Ambulatory Visit
Admission: RE | Admit: 2018-10-10 | Discharge: 2018-10-10 | Disposition: A | Discharge status: Home / Self Care | Payer: Managed Care, Other (non HMO) | Source: Ambulatory Visit | Attending: Internal Medicine | Admitting: Internal Medicine

## 2018-10-10 DIAGNOSIS — Z1231 Encounter for screening mammogram for malignant neoplasm of breast: Secondary | ICD-10-CM | POA: Insufficient documentation

## 2018-10-19 ENCOUNTER — Ambulatory Visit: Payer: Self-pay | Admitting: Family Medicine

## 2018-10-19 VITALS — BP 140/80 | HR 63 | Temp 98.4°F | Resp 16 | Ht 62.0 in | Wt 155.3 lb

## 2018-10-19 DIAGNOSIS — R059 Cough, unspecified: Secondary | ICD-10-CM

## 2018-10-19 DIAGNOSIS — R05 Cough: Secondary | ICD-10-CM

## 2018-10-19 MED ORDER — BENZONATATE 200 MG PO CAPS: 200.0000 mg | ORAL_CAPSULE | Freq: Every evening | ORAL | 0 refills | Status: DC | PRN

## 2018-10-19 NOTE — Progress Notes (Signed)
Subjective: congestion     Brandy Hamilton is a 62 y.o. female who presents for evaluation of nasal congestion, nonproductive cough, and sore throat that began last night.  Patient reports symptoms have significantly improved since she got up and began moving around this morning.  Patient reports she almost canceled her appointment here today because she felt so much better.  Patient has a history of asthma but has not been using her Xopenex inhaler at all.  Patient reports it has been a long time since she is used any inhaler.  Denies daily medications for asthma.  Reports 1 or less exacerbation a year requiring oral corticosteroids.  Denies hospitalization or intubation for asthma.  Patient also has a history of allergic rhinitis, which she takes Xyzal and Singulair for.  Patient received a series of allergy shots in the past, which have helped control her symptoms significantly.  Patient denies any immunosuppressing conditions or taking any immunosuppressive medications.  Patient has a history of breast cancer, reports being cancer free since 2009.  Denies any medications/treatments for this since then. Treatment to date: Nothing in particular.  Denies rash, nausea, vomiting, diarrhea, SOB, wheezing, chest or back pain, ear pain, difficulty swallowing, confusion, anosmia/hyposmia, dental pain, facial pressure/unilateral facial pain, pain exacerbated by bending over, headache, body aches, fatigue, fever, chills, severe symptoms, or initial improvement and then worsening of symptoms. History of smoking, asthma, COPD: Positive for asthma as described above.  Occasional tobacco use.  Denies any history of COPD. History of recurrent sinus and/or lung infections: Negative.  Review of Systems Pertinent items noted in HPI and remainder of comprehensive ROS otherwise negative.     Objective:   Physical Exam General: Awake, alert, and oriented. No acute distress. Well developed, hydrated and nourished. Appears  stated age. Nontoxic appearance.  HEENT:  PND noted.  Mild erythema to posterior oropharynx.  No edema or exudates of pharynx or tonsils. No erythema or bulging of TM.  Mild erythema/edema to nasal mucosa. Sinuses nontender. Supple neck without adenopathy. Cardiac: Heart rate and rhythm are normal. No murmurs, gallops, or rubs are auscultated. S1 and S2 are heard and are of normal intensity.  Respiratory: No signs of respiratory distress. Lungs clear. No tachypnea. Able to speak in full sentences without dyspnea. Nonlabored respirations.  Skin: Skin is warm, dry and intact. Appropriate color for ethnicity. No cyanosis noted.   Assessment:    asthma and viral upper respiratory illness   Plan:    Discussed diagnosis and treatment of URI. Discussed the importance of avoiding unnecessary antibiotic therapy. Suggested symptomatic OTC remedies. Nasal saline spray for congestion.   Advised patient to use her levalbuterol inhaler as needed, as prescribed by her primary care provider. Prescribed Tessalon Perles to use as needed at night for cough.  Discussed side/adverse effects and precautions of medications.  Discussed red flag symptoms and circumstances with which to seek medical care.   New Prescriptions   BENZONATATE (TESSALON) 200 MG CAPSULE    Take 1 capsule (200 mg total) by mouth at bedtime as needed for cough.

## 2019-08-16 ENCOUNTER — Other Ambulatory Visit: Payer: Self-pay | Admitting: Internal Medicine

## 2019-08-16 DIAGNOSIS — Z1231 Encounter for screening mammogram for malignant neoplasm of breast: Secondary | ICD-10-CM

## 2019-09-04 ENCOUNTER — Other Ambulatory Visit: Payer: Self-pay

## 2019-09-04 ENCOUNTER — Encounter: Payer: Self-pay | Admitting: Internal Medicine

## 2019-09-04 ENCOUNTER — Ambulatory Visit (INDEPENDENT_AMBULATORY_CARE_PROVIDER_SITE_OTHER): Payer: Managed Care, Other (non HMO) | Admitting: Internal Medicine

## 2019-09-04 VITALS — BP 124/88 | HR 58 | Temp 98.4°F | Ht 62.5 in | Wt 158.0 lb

## 2019-09-04 DIAGNOSIS — I10 Essential (primary) hypertension: Secondary | ICD-10-CM

## 2019-09-04 DIAGNOSIS — K589 Irritable bowel syndrome without diarrhea: Secondary | ICD-10-CM

## 2019-09-04 DIAGNOSIS — J4530 Mild persistent asthma, uncomplicated: Secondary | ICD-10-CM

## 2019-09-04 DIAGNOSIS — Z0000 Encounter for general adult medical examination without abnormal findings: Secondary | ICD-10-CM

## 2019-09-04 DIAGNOSIS — E785 Hyperlipidemia, unspecified: Secondary | ICD-10-CM

## 2019-09-04 NOTE — Progress Notes (Signed)
Subjective:    Patient ID: Brandy Hamilton, female    DOB: 1956/09/15, 63 y.o.   MRN: [id number]  HPI Here for physical  Has been having some rectal urgency Pain a while back---seemed to be from hemorrhoids--and this is better Briefly had blood from this Colonoscopy done 1 year ago and recall 3 years (due to large polyps) Discussed trying without the fish oil for a while  Working part time at [employer] off with COVID--and doesn't tolerate mask well Permanently done with this Still has her rental properties  Asthma controlled RLS controlled with the ropinirole  Current Outpatient Medications on File Prior to Visit  Medication Sig Dispense Refill  . amLODipine (NORVASC) 10 MG tablet Take 1 tablet (10 mg total) by mouth daily. 90 tablet 3  . bisoprolol-hydrochlorothiazide (ZIAC) 10-6.25 MG tablet Take 1 tablet by mouth daily. 90 tablet 3  . Calcium Carbonate (CALCIUM 500 PO) Take by mouth daily.      . fish oil-omega-3 fatty acids 1000 MG capsule Take 2 g by mouth daily.      Marland Kitchen levalbuterol (XOPENEX HFA) 45 MCG/ACT inhaler Inhale 1-2 puffs into the lungs every 6 (six) hours as needed. 1 Inhaler 1  . levocetirizine (XYZAL) 5 MG tablet TAKE 1 TABLET BY MOUTH  EVERY EVENING 90 tablet 3  . losartan (COZAAR) 100 MG tablet Take 1 tablet (100 mg total) by mouth daily. 90 tablet 3  . montelukast (SINGULAIR) 10 MG tablet Take 1 tablet (10 mg total) by mouth at bedtime. 90 tablet 3  . Potassium 75 MG TABS Take by mouth daily.      Marland Kitchen rOPINIRole (REQUIP) 0.5 MG tablet Take 1 tablet (0.5 mg total) by mouth at bedtime. 90 tablet 3   No current facility-administered medications on file prior to visit.     Allergies  Allergen Reactions  . Beclomethasone Dipropionate Other (See Comments)    Patient unsure of reaction.  . Levofloxacin Other (See Comments)    Patient unsure of reaction.   . Ramipril     REACTION: Cough    Past Medical History:  Diagnosis Date  . Allergy    pollen,  seasonal, perfumes, medications  . Asthma   . Breast cancer ([place]) 2008   radiation  . Cancer ([place]) 11/07/2007   left partial mastectomy done 12/12/2007-DCIS ER/PR neg,positive margins identified to re-excisione done   . DCIS (ductal carcinoma in situ) of breast    partial mastectomy and RT-----------------Dr Byrnett  . Hyperlipidemia   . Hypertension    1998  . Personal history of tobacco use, presenting hazards to health   . Special screening for malignant neoplasms, colon     Past Surgical History:  Procedure Laterality Date  . BELPHAROPTOSIS REPAIR Bilateral 01/2017  . BREAST BIOPSY Left    1998    Breast biopsy negative - Byrnett, 3/00     Left breast biopsy- stereotactic10/05   Left breast biopsy - (-) Byrnett11/08   Left breast biopsy--L partial mastectomy for DCIS, ,   . BREAST EXCISIONAL BIOPSY Left 2008   positive  . BREAST SURGERY Left 11/07/2007   3 cm area of DCIS, resected the negative margins. PR: 0%; ER less than 5%. Treated by whole breast radiation, patient declined antiestrogen therapy.  . COLONOSCOPY  2009   hyperplastic polyp from rectum, Dr. Bary Castilla  . COLONOSCOPY WITH PROPOFOL N/A 08/31/2018   Procedure: COLONOSCOPY WITH PROPOFOL;  Surgeon: Robert Bellow, MD;  Location: [facility] ENDOSCOPY;  Service: Endoscopy;  Laterality: N/A;    Family History  Problem Relation Age of Onset  . Cancer Maternal Grandfather   . Cancer Other        breast cancer  . Heart disease Mother   . Hypertension Mother   . Breast cancer Neg Hx     Social History   Socioeconomic History  . Marital status: Divorced    Spouse name: Not on file  . Number of children: 0  . Years of education: Not on file  . Highest education level: Not on file  Occupational History  . 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 Occupation: Bookkeeping, taxes---part time    Comment:    . Occupation: Rental properties  . Occupation:    Social Needs  . Financial  resource strain: Not on file  . Food insecurity    Worry: Not on file    Inability: Not on file  . Transportation needs    Medical: Not on file    Non-medical: Not on file  Tobacco Use  . Smoking status: Current Some Day Smoker    Packs/day: 0.25    Years: 5.00    Pack years: 1.25    Types: Cigarettes  . Smokeless tobacco: Never Used  . Tobacco comment: discussed cold [place] stopping since not an everyday smoker  Substance and Sexual Activity  . Alcohol use: Yes    Comment: drinks rarely  . Drug use: No  . Sexual activity: Not on file  Lifestyle  . Physical activity    Days per week: Not on file    Minutes per session: Not on file  . Stress: Not on file  Relationships  . Social Herbalist on phone: Not on file    Gets together: Not on file    Attends religious service: Not on file    Active member of club or organization: Not on file    Attends meetings of clubs or organizations: Not on file    Relationship status: Not on file  . Intimate partner violence    Fear of current or ex partner: Not on file    Emotionally abused: Not on file    Physically abused: Not on file    Forced sexual activity: Not on file  Other Topics Concern  . Not on file  Social History Narrative  . Not on file   Review of Systems  Constitutional:       Weight up some with COVID Not exercising --discussed  Wears seat belt mostly--discussed  HENT: Negative for hearing loss and tinnitus.        Had toothache during COVID--needed extraction  Eyes: Negative for visual disturbance.       No diplopia or unilateral vision loss Having some headaches behind eyes--is going in soon  Respiratory: Positive for wheezing.        Cough with chemical exposure--benzonatate helped Stable SOB at times---doesn't seem to be the limiting factor  Cardiovascular: Negative for chest pain, palpitations and leg swelling.  Gastrointestinal:       Some pain with urgency Some blood after straining with  bowels--on paper Some heartburn--tums helps  Endocrine: Negative for polydipsia and polyuria.  Genitourinary: Negative for dyspareunia, dysuria and hematuria.  Musculoskeletal: Positive for arthralgias and back pain. Negative for joint swelling.       Relates aches and pains to "old age"  Skin: Negative for rash.       Has spot on right  cheek---there for years. Flares up if she is scratching at it  Allergic/Immunologic: Positive for environmental allergies. Negative for immunocompromised state.  Neurological: Positive for headaches. Negative for dizziness, syncope and light-headedness.  Hematological: Negative for adenopathy. Does not bruise/bleed easily.  Psychiatric/Behavioral: Positive for sleep disturbance. Negative for dysphoric mood. The patient is not nervous/anxious.        COVID related mood issues--mild       Objective:   Physical Exam  Constitutional: She is oriented to person, place, and time. She appears well-developed. No distress.  HENT:  Head: Normocephalic and atraumatic.  Right Ear: External ear normal.  Left Ear: External ear normal.  Mouth/Throat: Oropharynx is clear and moist. No oropharyngeal exudate.  Eyes: Pupils are equal, round, and reactive to light. Conjunctivae are normal.  Neck: No thyromegaly present.  Cardiovascular: Normal rate, regular rhythm, normal heart sounds and intact distal pulses. Exam reveals no gallop.  No murmur heard. Respiratory: Effort normal and breath sounds normal. No respiratory distress. She has no wheezes. She has no rales.  GI: Soft. There is no abdominal tenderness.  Musculoskeletal:        General: No tenderness or edema.  Lymphadenopathy:    She has no cervical adenopathy.  Neurological: She is alert and oriented to person, place, and time.  Skin: No rash noted. No erythema.  Psychiatric: She has a normal mood and affect. Her behavior is normal.           Assessment & Plan:

## 2019-09-04 NOTE — Assessment & Plan Note (Signed)
Colon due 2022 Just set up mammogram Pap due next year Discussed fitness/exercise [place] flu vaccine through the county

## 2019-09-04 NOTE — Assessment & Plan Note (Signed)
Doing okay now

## 2019-09-04 NOTE — Assessment & Plan Note (Signed)
BP Readings from Last 3 Encounters:  09/04/19 124/88  10/19/18 140/80  08/31/18 (!) 158/91   Good control Due for labs

## 2019-09-04 NOTE — Assessment & Plan Note (Signed)
IBS type symptoms Will have her try off the fish oil--- consider GI if worsens

## 2019-09-04 NOTE — Assessment & Plan Note (Signed)
Mostly triglycerides Will recheck

## 2019-09-05 LAB — COMPREHENSIVE METABOLIC PANEL
ALT: 22 U/L (ref 0–35)
AST: 18 U/L (ref 0–37)
Albumin: 4.1 g/dL (ref 3.5–5.2)
Alkaline Phosphatase: 87 U/L (ref 39–117)
BUN: 21 mg/dL (ref 6–23)
CO2: 27 mEq/L (ref 19–32)
Calcium: 10.6 mg/dL — ABNORMAL HIGH (ref 8.4–10.5)
Chloride: 105 mEq/L (ref 96–112)
Creatinine, Ser: 0.79 mg/dL (ref 0.40–1.20)
GFR: 73.94 mL/min (ref >60.00)
Glucose, Bld: 89 mg/dL (ref 70–99)
Potassium: 4.1 mEq/L (ref 3.5–5.1)
Sodium: 140 mEq/L (ref 135–145)
Total Bilirubin: 0.5 mg/dL (ref 0.2–1.2)
Total Protein: 7.1 g/dL (ref 6.0–8.3)

## 2019-09-05 LAB — CBC
HCT: 43.1 % (ref 36.0–46.0)
Hemoglobin: 14.6 g/dL (ref 12.0–15.0)
MCHC: 33.8 g/dL (ref 30.0–36.0)
MCV: 97.9 fl (ref 78.0–100.0)
Platelets: 282 10*3/uL (ref 150.0–400.0)
RBC: 4.41 Mil/uL (ref 3.87–5.11)
RDW: 12.4 % (ref 11.5–15.5)
WBC: 9.5 10*3/uL (ref 4.0–10.5)

## 2019-09-05 LAB — LIPID PANEL
Cholesterol: 227 mg/dL — ABNORMAL HIGH (ref 0–200)
HDL: 43.6 mg/dL (ref >39.00)
NonHDL: 183.88
Total CHOL/HDL Ratio: 5
Triglycerides: 251 mg/dL — ABNORMAL HIGH (ref 0.0–149.0)
VLDL: 50.2 mg/dL — ABNORMAL HIGH (ref 0.0–40.0)

## 2019-09-05 LAB — LDL CHOLESTEROL, DIRECT: Direct LDL: 153 mg/dL

## 2019-09-05 LAB — T4, FREE: Free T4: 1.07 ng/dL (ref 0.60–1.60)

## 2019-09-09 IMAGING — MG MM DIGITAL SCREENING BILAT W/ CAD
5 series · 5 of 5 positions shown · non-contrast
Comparison: Previous exam(s).

CLINICAL DATA: Screening. History of left breast cancer status post
lumpectomy in 3770.

EXAM:
DIGITAL SCREENING BILATERAL MAMMOGRAM WITH CAD

[R MLO]
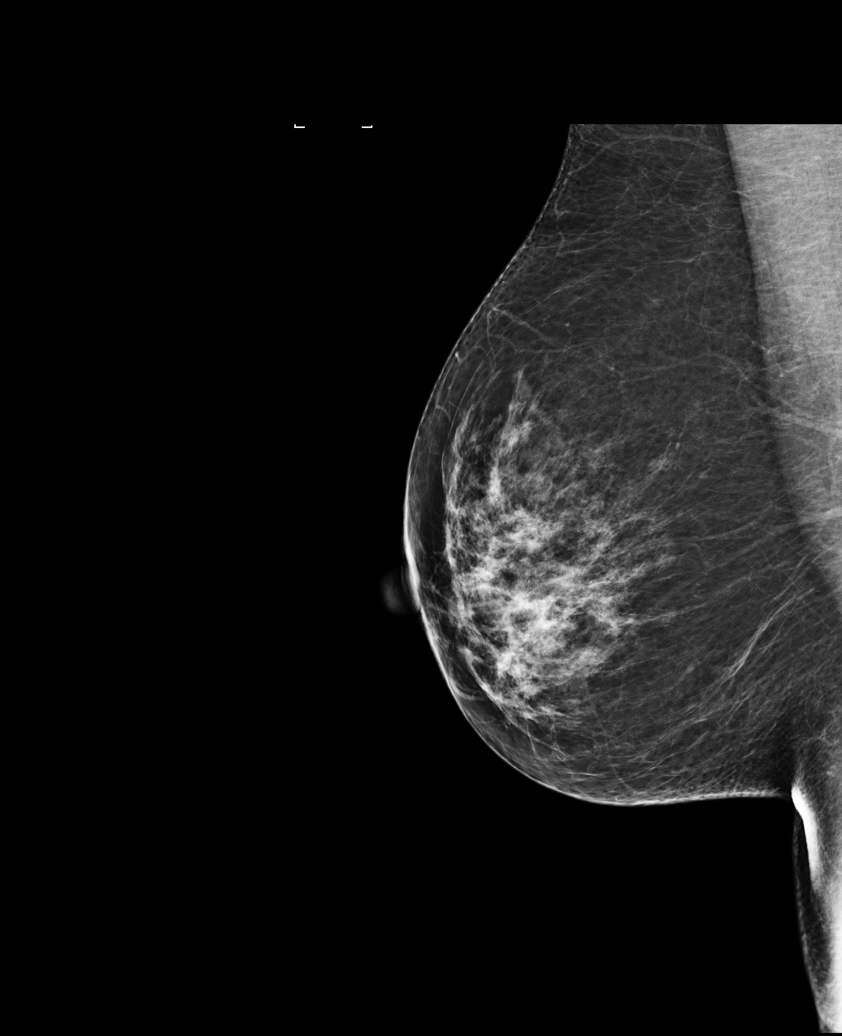

[L CC (1 of 2)]
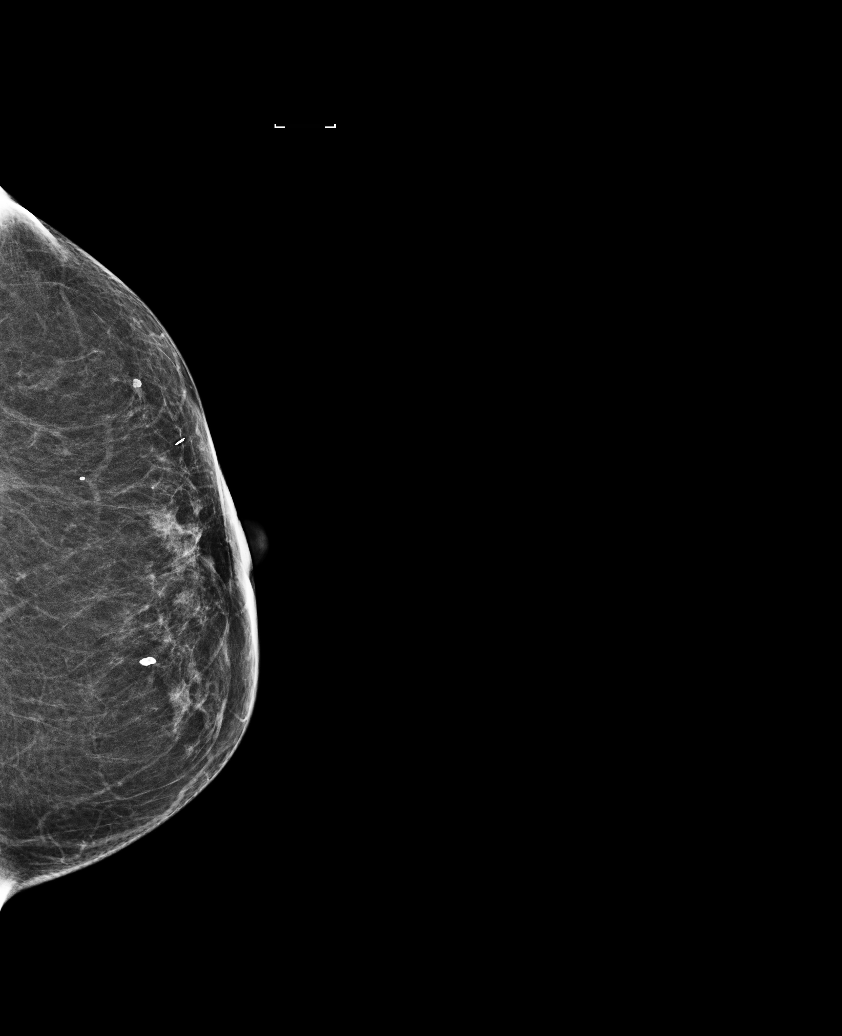

[L MLO]
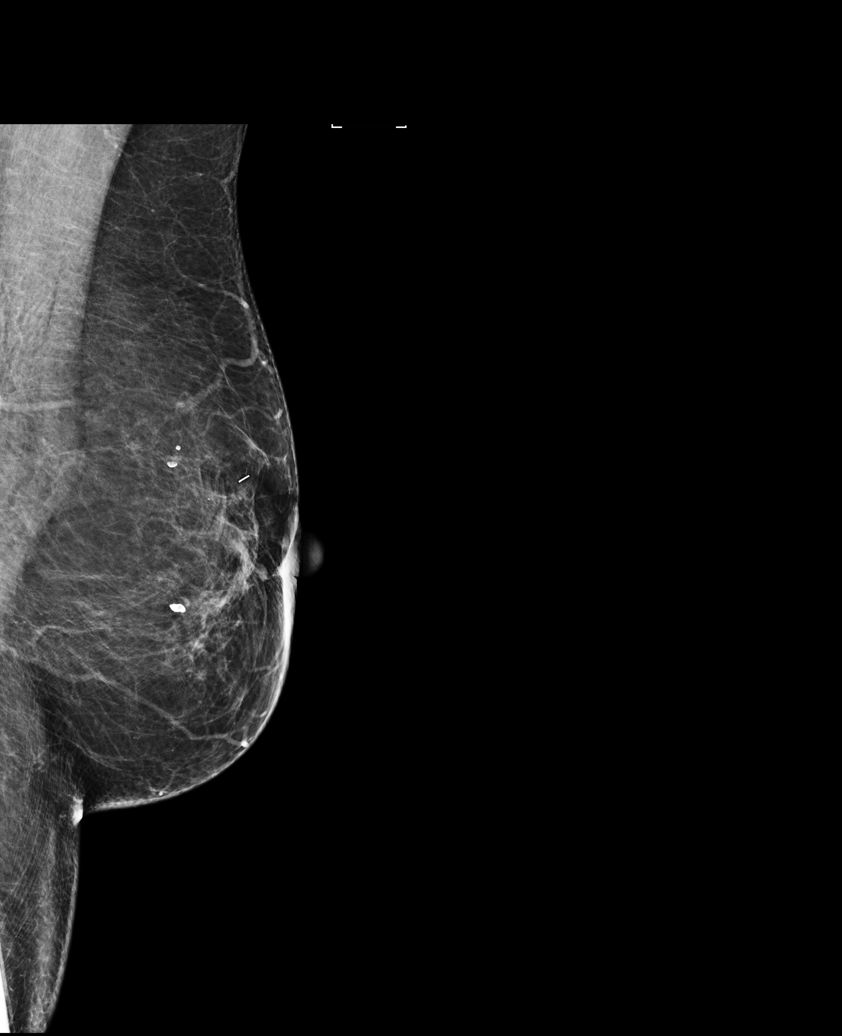

[R CC]
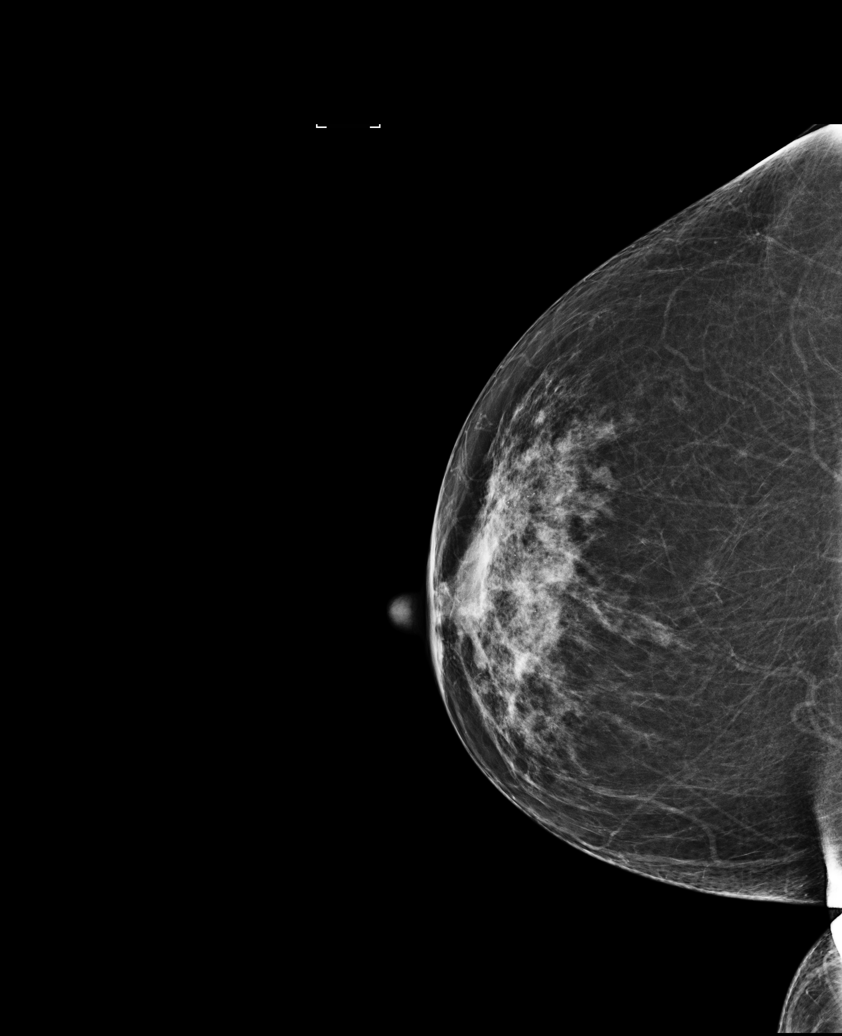

[L CC (2 of 2)]
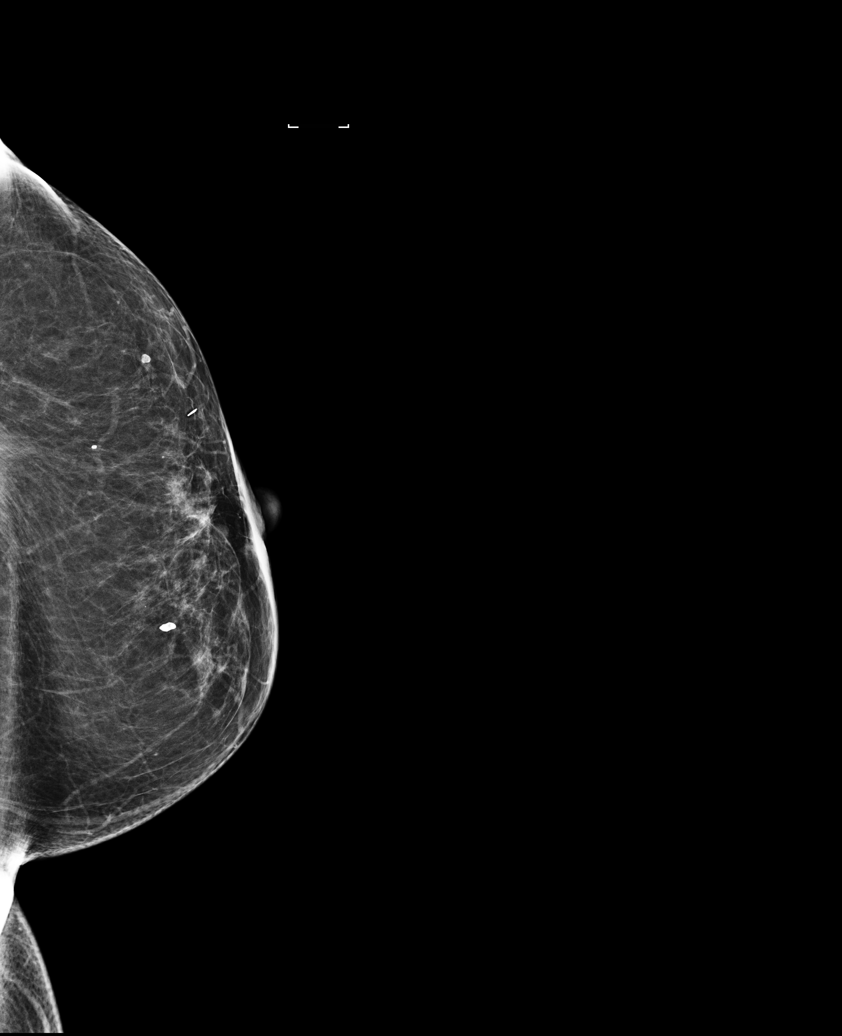

[5 of 5 positions shown; findings below may reference images not displayed]

ACR Breast Density Category b: There are scattered areas of
fibroglandular density.
FINDINGS: There are no findings suspicious for malignancy. Images were
processed with CAD.
IMPRESSION: No mammographic evidence of malignancy. A result letter of this
screening mammogram will be mailed directly to the patient.

RECOMMENDATION:
Screening mammogram in one year. (Code:K3-J-ML3)

BI-RADS CATEGORY  1: Negative.

## 2019-10-11 ENCOUNTER — Other Ambulatory Visit: Payer: Self-pay | Admitting: Internal Medicine

## 2019-10-12 ENCOUNTER — Ambulatory Visit
Admission: RE | Admit: 2019-10-12 | Discharge: 2019-10-12 | Disposition: A | Discharge status: Home / Self Care | Payer: Managed Care, Other (non HMO) | Source: Ambulatory Visit | Attending: Internal Medicine | Admitting: Internal Medicine

## 2019-10-12 DIAGNOSIS — Z1231 Encounter for screening mammogram for malignant neoplasm of breast: Secondary | ICD-10-CM | POA: Insufficient documentation

## 2020-04-06 ENCOUNTER — Other Ambulatory Visit: Payer: Self-pay

## 2020-04-06 MED ORDER — ROPINIROLE HCL 0.5 MG PO TABS: 0.5000 mg | ORAL_TABLET | Freq: Every day | ORAL | 3 refills | Status: DC

## 2020-04-06 MED ORDER — AMLODIPINE BESYLATE 10 MG PO TABS: 10.0000 mg | ORAL_TABLET | Freq: Every day | ORAL | 3 refills | Status: DC

## 2020-04-06 MED ORDER — LOSARTAN POTASSIUM 100 MG PO TABS: 100.0000 mg | ORAL_TABLET | Freq: Every day | ORAL | 3 refills | Status: DC

## 2020-04-06 MED ORDER — BISOPROLOL-HYDROCHLOROTHIAZIDE 10-6.25 MG PO TABS: 1.0000 | ORAL_TABLET | Freq: Every day | ORAL | 3 refills | Status: DC

## 2020-04-06 MED ORDER — MONTELUKAST SODIUM 10 MG PO TABS: 10.0000 mg | ORAL_TABLET | Freq: Every day | ORAL | 3 refills | Status: DC

## 2020-04-06 NOTE — Telephone Encounter (Signed)
Pharmacy benefits through [employer] now.

## 2020-08-28 ENCOUNTER — Other Ambulatory Visit: Payer: Self-pay | Admitting: Internal Medicine

## 2020-08-28 DIAGNOSIS — Z1231 Encounter for screening mammogram for malignant neoplasm of breast: Secondary | ICD-10-CM

## 2020-09-09 ENCOUNTER — Encounter: Payer: Managed Care, Other (non HMO) | Admitting: Internal Medicine

## 2020-09-19 ENCOUNTER — Other Ambulatory Visit (HOSPITAL_COMMUNITY)
Admission: RE | Admit: 2020-09-19 | Discharge: 2020-09-19 | Disposition: A | Discharge status: Home / Self Care | Payer: Managed Care, Other (non HMO) | Source: Ambulatory Visit | Attending: Internal Medicine | Admitting: Internal Medicine

## 2020-09-19 ENCOUNTER — Other Ambulatory Visit: Payer: Self-pay

## 2020-09-19 ENCOUNTER — Ambulatory Visit (INDEPENDENT_AMBULATORY_CARE_PROVIDER_SITE_OTHER): Payer: Managed Care, Other (non HMO) | Admitting: Internal Medicine

## 2020-09-19 ENCOUNTER — Encounter: Payer: Self-pay | Admitting: Internal Medicine

## 2020-09-19 VITALS — BP 122/82 | HR 72 | Temp 97.5°F | Ht 62.5 in | Wt 138.0 lb

## 2020-09-19 DIAGNOSIS — J4520 Mild intermittent asthma, uncomplicated: Secondary | ICD-10-CM | POA: Diagnosis not present

## 2020-09-19 DIAGNOSIS — Z0000 Encounter for general adult medical examination without abnormal findings: Secondary | ICD-10-CM | POA: Insufficient documentation

## 2020-09-19 DIAGNOSIS — I10 Essential (primary) hypertension: Secondary | ICD-10-CM | POA: Diagnosis not present

## 2020-09-19 DIAGNOSIS — G2581 Restless legs syndrome: Secondary | ICD-10-CM | POA: Diagnosis not present

## 2020-09-19 DIAGNOSIS — Z202 Contact with and (suspected) exposure to infections with a predominantly sexual mode of transmission: Secondary | ICD-10-CM | POA: Insufficient documentation

## 2020-09-19 NOTE — Addendum Note (Signed)
Addended by: Pilar Grammes on: 09/19/2020 05:33 PM   Modules accepted: Orders

## 2020-09-19 NOTE — Progress Notes (Signed)
Subjective:    Patient ID: Brandy Hamilton, female    DOB: 11/05/1956, 64 y.o.   MRN: [id number]  HPI Here for physical This visit occurred during the SARS-CoV-2 public health emergency.  Safety protocols were in place, including screening questions prior to the visit, additional usage of staff PPE, and extensive cleaning of exam room while observing appropriate contact time as indicated for disinfecting solutions.   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 amLODipine (NORVASC) 10 MG tablet Take 1 tablet (10 mg total) by mouth daily. 90 tablet 3  . bisoprolol-hydrochlorothiazide (ZIAC) 10-6.25 MG tablet Take 1 tablet by mouth daily. 90 tablet 3  . Calcium Carbonate (CALCIUM 500 PO) Take by mouth daily.      Marland Kitchen levalbuterol (XOPENEX HFA) 45 MCG/ACT inhaler Inhale 1-2 puffs into the lungs every 6 (six) hours as needed. 1 Inhaler 1  . levocetirizine (XYZAL) 5 MG tablet TAKE 1 TABLET BY MOUTH  EVERY EVENING 90 tablet 3  . losartan (COZAAR) 100 MG tablet Take 1 tablet (100 mg total) by mouth daily. 90 tablet 3  . montelukast (SINGULAIR) 10 MG tablet Take 1 tablet (10 mg total) by mouth at bedtime. 90 tablet 3  . Potassium 75 MG TABS Take by mouth daily.      Marland Kitchen rOPINIRole (REQUIP) 0.5 MG tablet Take 1 tablet (0.5 mg total) by mouth at bedtime. 90 tablet 3   No current facility-administered medications on file prior to visit.    Allergies  Allergen Reactions  .  Beclomethasone Dipropionate Other (See Comments)    Patient unsure of reaction.  . Levofloxacin Other (See Comments)    Patient unsure of reaction.   . Ramipril     REACTION: Cough    Past Medical History:  Diagnosis Date  . Allergy    pollen, seasonal, perfumes, medications  . Asthma   . Breast cancer ([place]) 2008   radiation  . Cancer ([place]) 11/07/2007   left partial mastectomy done 12/12/2007-DCIS ER/PR neg,positive margins identified to re-excisione done   . DCIS (ductal carcinoma in situ) of breast    partial mastectomy and RT-----------------Dr Byrnett  . Hyperlipidemia   . Hypertension    1998  . Personal history of tobacco use, presenting hazards to health   . Special screening for malignant neoplasms, colon     Past Surgical History:  Procedure Laterality Date  . BELPHAROPTOSIS REPAIR Bilateral 01/2017  . BREAST BIOPSY Left    1998    Breast biopsy negative - Byrnett, 3/00     Left breast biopsy- stereotactic10/05   Left breast biopsy - (-) Byrnett11/08   Left breast biopsy--L partial mastectomy for DCIS, ,   . BREAST EXCISIONAL BIOPSY Left 2008   positive  . BREAST SURGERY Left 11/07/2007   3 cm area of DCIS, resected the negative margins. PR: 0%; ER less than 5%. Treated by whole breast radiation, patient declined antiestrogen therapy.  . COLONOSCOPY  2009   hyperplastic polyp from  rectum, Dr. Bary Castilla  . COLONOSCOPY WITH PROPOFOL N/A 08/31/2018   Procedure: COLONOSCOPY WITH PROPOFOL;  Surgeon: Robert Bellow, MD;  Location: [facility] ENDOSCOPY;  Service: Endoscopy;  Laterality: N/A;    Family History  Problem Relation Age of Onset  . Cancer Maternal Grandfather   . Cancer Other        breast cancer  . Heart disease Mother   . Hypertension Mother   . Breast cancer Neg Hx     Social History   Socioeconomic History  . Marital status: Divorced    Spouse name: Not on file  . Number of children: 0  . Years of education: Not on file  . Highest education level:  Not on file  Occupational History  . 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 Occupation: Bookkeeping, taxes---part time    Comment:    . Occupation: Rental properties  . Occupation:    Tobacco Use  . Smoking status: Current Some Day Smoker    Packs/day: 0.25    Years: 5.00    Pack years: 1.25    Types: Cigarettes  . Smokeless tobacco: Never Used  . Tobacco comment: discussed cold [place] stopping since not an everyday smoker  Vaping Use  . Vaping Use: Never used  Substance and Sexual Activity  . Alcohol use: Yes    Comment: drinks rarely  . Drug use: No  . Sexual activity: Not on file  Other Topics Concern  . Not on file  Social History Narrative  . Not on file   Social Determinants of Health   Financial Resource Strain:   . Difficulty of Paying Living Expenses: Not on file  Food Insecurity:   . Worried About Charity fundraiser in the Last Year: Not on file  . Ran Out of Food in the Last Year: Not on file  Transportation Needs:   . Lack of Transportation (Medical): Not on file  . Lack of Transportation (Non-Medical): Not on file  Physical Activity:   . Days of Exercise per Week: Not on file  . Minutes of Exercise per Session: Not on file  Stress:   . Feeling of Stress : Not on file  Social Connections:   . Frequency of Communication with Friends and Family: Not on file  . Frequency of Social Gatherings with Friends and Family: Not on file  . Attends Religious Services: Not on file  . Active Member of Clubs or Organizations: Not on file  . Attends Archivist Meetings: Not on file  . Marital Status: Not on file  Intimate Partner Violence:   . Fear of Current or Ex-Partner: Not on file  . Emotionally Abused: Not on file  . Physically Abused: Not on file  . Sexually Abused: Not on file   Review of Systems  Constitutional: Positive for unexpected weight change. Negative for fatigue.       Wears seat belt usually  HENT:  Negative for hearing loss, tinnitus and trouble swallowing.        Hit teeth---and some injury. Keeps up with dentist  Eyes: Negative for visual disturbance.       No diplopia or unilateral vision loss  Respiratory: Positive for cough. Negative for shortness of breath.        Still smoking--but not every day Discussed stopping with lozenges to facilitate  Cardiovascular: Negative for chest pain, palpitations and leg swelling.  Gastrointestinal: Negative for abdominal pain and blood in stool.  Occ heartburn--tums helps  Endocrine: Negative for polydipsia and polyuria.  Genitourinary: Negative for dyspareunia, dysuria and hematuria.       Some itching  Musculoskeletal: Negative for arthralgias, back pain and joint swelling.  Skin: Negative for rash.  Allergic/Immunologic: Positive for environmental allergies. Negative for immunocompromised state.  Neurological: Negative for dizziness, syncope and light-headedness.       Occ headaches--aleve works  Hematological: Negative for adenopathy. Does not bruise/bleed easily.  Psychiatric/Behavioral: Negative for dysphoric mood and sleep disturbance. The patient is not nervous/anxious.        Stress with COVID etc No daily symptoms       Objective:   Physical Exam Constitutional:      Appearance: Normal appearance.  HENT:     Right Ear: Tympanic membrane, ear canal and external ear normal.     Left Ear: Tympanic membrane, ear canal and external ear normal.     Mouth/Throat:     Pharynx: No oropharyngeal exudate or posterior oropharyngeal erythema.  Eyes:     Conjunctiva/sclera: Conjunctivae normal.     Pupils: Pupils are equal, round, and reactive to light.  Cardiovascular:     Rate and Rhythm: Normal rate and regular rhythm.     Pulses: Normal pulses.     Heart sounds: No murmur heard.  No gallop.   Abdominal:     Palpations: Abdomen is soft.     Tenderness: There is no abdominal tenderness.  Genitourinary:    Comments: Normal  introitus Cervix is normal and closed No discharge Pap and specimen taken Bimanual shows no mass or tenderness Musculoskeletal:        General: No swelling.     Cervical back: Neck supple.     Right lower leg: No edema.     Left lower leg: No edema.  Lymphadenopathy:     Cervical: No cervical adenopathy.  Skin:    General: Skin is warm.     Findings: No rash.  Neurological:     General: No focal deficit present.     Mental Status: She is alert and oriented to person, place, and time.  Psychiatric:        Mood and Affect: Mood normal.        Behavior: Behavior normal.            Assessment & Plan:

## 2020-09-19 NOTE — Assessment & Plan Note (Signed)
Controlled with singulair Has inhaler for prn Urged her to give up the last of the cigarettes

## 2020-09-19 NOTE — Assessment & Plan Note (Signed)
Discussed condoms Will check vaginal specimens---GC, trich, Chlamydia RPR, HIV

## 2020-09-19 NOTE — Addendum Note (Signed)
Addended by: Pilar Grammes on: 09/19/2020 05:18 PM   Modules accepted: Orders

## 2020-09-19 NOTE — Patient Instructions (Signed)
Please get your COVID vaccine!!

## 2020-09-19 NOTE — Assessment & Plan Note (Signed)
Uses the requip

## 2020-09-19 NOTE — Assessment & Plan Note (Signed)
Healthy Yearly mammogram Pap done today Colon due next year Plans to restart exercise Urged her to go immediately for COVID vaccine

## 2020-09-19 NOTE — Assessment & Plan Note (Signed)
BP Readings from Last 3 Encounters:  09/19/20 122/82  09/04/19 124/88  10/19/18 140/80   Good control on losartan and bisoprolo/HCTZ Will check labs

## 2020-09-20 LAB — LIPID PANEL
Cholesterol: 185 mg/dL (ref 0–200)
HDL: 38.4 mg/dL — ABNORMAL LOW (ref >39.00)
NonHDL: 146.1
Total CHOL/HDL Ratio: 5
Triglycerides: 202 mg/dL — ABNORMAL HIGH (ref 0.0–149.0)
VLDL: 40.4 mg/dL — ABNORMAL HIGH (ref 0.0–40.0)

## 2020-09-20 LAB — COMPREHENSIVE METABOLIC PANEL
ALT: 31 U/L (ref 0–35)
AST: 28 U/L (ref 0–37)
Albumin: 4.2 g/dL (ref 3.5–5.2)
Alkaline Phosphatase: 110 U/L (ref 39–117)
BUN: 20 mg/dL (ref 6–23)
CO2: 26 mEq/L (ref 19–32)
Calcium: 10.2 mg/dL (ref 8.4–10.5)
Chloride: 104 mEq/L (ref 96–112)
Creatinine, Ser: 0.82 mg/dL (ref 0.40–1.20)
GFR: 70.59 mL/min (ref >60.00)
Glucose, Bld: 94 mg/dL (ref 70–99)
Potassium: 3.7 mEq/L (ref 3.5–5.1)
Sodium: 141 mEq/L (ref 135–145)
Total Bilirubin: 0.5 mg/dL (ref 0.2–1.2)
Total Protein: 7.7 g/dL (ref 6.0–8.3)

## 2020-09-20 LAB — CBC
HCT: 43.4 % (ref 36.0–46.0)
Hemoglobin: 14.6 g/dL (ref 12.0–15.0)
MCHC: 33.7 g/dL (ref 30.0–36.0)
MCV: 93.8 fl (ref 78.0–100.0)
Platelets: 359 10*3/uL (ref 150.0–400.0)
RBC: 4.63 Mil/uL (ref 3.87–5.11)
RDW: 12.7 % (ref 11.5–15.5)
WBC: 13.1 10*3/uL — ABNORMAL HIGH (ref 4.0–10.5)

## 2020-09-20 LAB — HIV ANTIBODY (ROUTINE TESTING W REFLEX): HIV 1&2 Ab, 4th Generation: NONREACTIVE

## 2020-09-20 LAB — LDL CHOLESTEROL, DIRECT: Direct LDL: 124 mg/dL

## 2020-09-20 LAB — C. TRACHOMATIS/N. GONORRHOEAE RNA
C. trachomatis RNA, TMA: NOT DETECTED
N. gonorrhoeae RNA, TMA: NOT DETECTED

## 2020-09-20 LAB — RPR: RPR Ser Ql: NONREACTIVE

## 2020-09-23 LAB — CYTOLOGY - PAP
Comment: NEGATIVE
Diagnosis: NEGATIVE
High risk HPV: NEGATIVE

## 2020-09-30 ENCOUNTER — Ambulatory Visit: Payer: Managed Care, Other (non HMO)

## 2020-12-03 ENCOUNTER — Other Ambulatory Visit: Payer: Self-pay

## 2020-12-03 ENCOUNTER — Ambulatory Visit
Admission: RE | Admit: 2020-12-03 | Discharge: 2020-12-03 | Disposition: A | Discharge status: Home / Self Care | Payer: Managed Care, Other (non HMO) | Source: Ambulatory Visit | Attending: Internal Medicine | Admitting: Internal Medicine

## 2020-12-03 DIAGNOSIS — Z1231 Encounter for screening mammogram for malignant neoplasm of breast: Secondary | ICD-10-CM | POA: Diagnosis not present

## 2021-04-01 ENCOUNTER — Other Ambulatory Visit: Payer: Self-pay | Admitting: Internal Medicine

## 2021-05-15 ENCOUNTER — Telehealth: Payer: Self-pay | Admitting: *Deleted

## 2021-05-15 NOTE — Telephone Encounter (Signed)
Ms. Milligan left voicemail on triage line stating she was suppose to leave for a trip on Monday.  She is now having diarrhea.  She also needs to know where she can get a Covid Test.  I returned patient's call, she states she is suppose to fly to [place] on Monday but now is having diarrhea.  She is not sure if it is IBS or just nerves. She also needs a Covid Test due to [place] internationally.  Spoke with Dr. Silvio Pate.  He is willing to do a virtual appointment with patient tomorrow and then bring her in tomorrow afternoon for Covid test.  Appointment scheduled at 12:15 pm.

## 2021-05-16 ENCOUNTER — Telehealth (INDEPENDENT_AMBULATORY_CARE_PROVIDER_SITE_OTHER): Payer: Managed Care, Other (non HMO) | Admitting: Internal Medicine

## 2021-05-16 ENCOUNTER — Telehealth: Payer: Self-pay

## 2021-05-16 ENCOUNTER — Encounter: Payer: Self-pay | Admitting: Internal Medicine

## 2021-05-16 ENCOUNTER — Other Ambulatory Visit: Payer: Self-pay

## 2021-05-16 ENCOUNTER — Other Ambulatory Visit (INDEPENDENT_AMBULATORY_CARE_PROVIDER_SITE_OTHER): Payer: Managed Care, Other (non HMO)

## 2021-05-16 VITALS — HR 80 | Temp 98.4°F | Wt 140.0 lb

## 2021-05-16 DIAGNOSIS — R197 Diarrhea, unspecified: Secondary | ICD-10-CM | POA: Insufficient documentation

## 2021-05-16 MED ORDER — LEVALBUTEROL TARTRATE 45 MCG/ACT IN AERO: 1.0000 | INHALATION_SPRAY | Freq: Four times a day (QID) | RESPIRATORY_TRACT | 1 refills | Status: DC | PRN

## 2021-05-16 MED ORDER — HYOSCYAMINE SULFATE 0.125 MG PO TABS: 0.1250 mg | ORAL_TABLET | Freq: Three times a day (TID) | ORAL | 0 refills | Status: DC | PRN

## 2021-05-16 NOTE — Assessment & Plan Note (Signed)
Has had a week of symptoms After eating and making her miserable---even with incontinence Not sure if this could be related to nerves---or a possible low level COVID infection She is convinced she will ruin the trip for her friends Will write note to not go on the trip  Do COVID test also Hyoscyamine for prn

## 2021-05-16 NOTE — Telephone Encounter (Signed)
Pt came for covid testing and asked if Dr Silvio Pate would write a rx for Benzonatate 200mg . She has a few left, but has been coughing some. She is aware it may be sometime this weekend before it is done. Please send to [place].

## 2021-05-16 NOTE — Progress Notes (Signed)
Subjective:    Patient ID: Brandy Hamilton, female    DOB: 30-May-1956, 65 y.o.   MRN: [id number]  HPI Video virtual visit due to diarrhea Identification done Reviewed limitations and billing and she gave consent Participants--patient in her home and I am in my office  Has trip to [place] ---leaving in 3 days Vacation with some friends Concerned about the trip due to some diarrhea  After eating, she has to go quickly May happen several times and finally be liquid No real prior symptoms like this--wondering if it is her nerves Most noticeable over the last week---even has had incontinence  Hasn't tried anything Now really anxious and doesn't want to ruin the trip for her friends  Current Outpatient Medications on File Prior to Visit  Medication Sig Dispense Refill  . amLODipine (NORVASC) 10 MG tablet TAKE 1 TABLET DAILY 90 tablet 3  . bisoprolol-hydrochlorothiazide (ZIAC) 10-6.25 MG tablet TAKE 1 TABLET DAILY 90 tablet 3  . Calcium Carbonate (CALCIUM 500 PO) Take by mouth daily.    . cetirizine (ZYRTEC) 10 MG tablet Take 10 mg by mouth daily.    Marland Kitchen levalbuterol (XOPENEX HFA) 45 MCG/ACT inhaler Inhale 1-2 puffs into the lungs every 6 (six) hours as needed. 1 Inhaler 1  . losartan (COZAAR) 100 MG tablet TAKE 1 TABLET DAILY 90 tablet 3  . montelukast (SINGULAIR) 10 MG tablet TAKE 1 TABLET AT BEDTIME 90 tablet 3  . Potassium 75 MG TABS Take by mouth daily.    Marland Kitchen rOPINIRole (REQUIP) 0.5 MG tablet TAKE 1 TABLET AT BEDTIME 90 tablet 3   No current facility-administered medications on file prior to visit.    Allergies  Allergen Reactions  . Beclomethasone Dipropionate Other (See Comments)    Patient unsure of reaction.  . Levofloxacin Other (See Comments)    Patient unsure of reaction.   . Ramipril     REACTION: Cough    Past Medical History:  Diagnosis Date  . Allergy    pollen, seasonal, perfumes, medications  . Asthma   . Breast cancer ([place]) 2008   radiation  . Cancer  ([place]) 11/07/2007   left partial mastectomy done 12/12/2007-DCIS ER/PR neg,positive margins identified to re-excisione done   . DCIS (ductal carcinoma in situ) of breast    partial mastectomy and RT-----------------Dr Byrnett  . Hyperlipidemia   . Hypertension    1998  . Personal history of tobacco use, presenting hazards to health   . Special screening for malignant neoplasms, colon     Past Surgical History:  Procedure Laterality Date  . BELPHAROPTOSIS REPAIR Bilateral 01/2017  . BREAST BIOPSY Left    1998    Breast biopsy negative - Byrnett, 3/00     Left breast biopsy- stereotactic10/05   Left breast biopsy - (-) Byrnett11/08   Left breast biopsy--L partial mastectomy for DCIS, ,   . BREAST EXCISIONAL BIOPSY Left 2008   positive  . BREAST SURGERY Left 11/07/2007   3 cm area of DCIS, resected the negative margins. PR: 0%; ER less than 5%. Treated by whole breast radiation, patient declined antiestrogen therapy.  . COLONOSCOPY  2009   hyperplastic polyp from rectum, Dr. Bary Castilla  . COLONOSCOPY WITH PROPOFOL N/A 08/31/2018   Procedure: COLONOSCOPY WITH PROPOFOL;  Surgeon: Robert Bellow, MD;  Location: [facility] ENDOSCOPY;  Service: Endoscopy;  Laterality: N/A;    Family History  Problem Relation Age of Onset  . Cancer Maternal Grandfather   . Cancer Other  breast cancer  . Heart disease Mother   . Hypertension Mother   . Breast cancer Neg Hx     Social History   Socioeconomic History  . Marital status: Divorced    Spouse name: Not on file  . Number of children: 0  . Years of education: Not on file  . Highest education level: Not on file  Occupational History  . 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 Occupation: Bookkeeping, taxes---part time    Comment:    . Occupation: Rental properties  . Occupation:    Tobacco Use  . Smoking status: Current Some Day Smoker    Packs/day: 0.25    Years: 5.00    Pack years: 1.25    Types:  Cigarettes  . Smokeless tobacco: Never Used  . Tobacco comment: discussed cold [place] stopping since not an everyday smoker  Vaping Use  . Vaping Use: Never used  Substance and Sexual Activity  . Alcohol use: Yes    Comment: drinks rarely  . Drug use: No  . Sexual activity: Not on file  Other Topics Concern  . Not on file  Social History Narrative  . Not on file   Social Determinants of Health   Financial Resource Strain: Not on file  Food Insecurity: Not on file  Transportation Needs: Not on file  Physical Activity: Not on file  Stress: Not on file  Social Connections: Not on file  Intimate Partner Violence: Not on file   Review of Systems  Had some recent cough--has had COVID exposure No fever Some cold type fever No change in smell or taste No abdominal pain No N/V Eating fairly well     Objective:   Physical Exam Constitutional:      Appearance: Normal appearance.  Pulmonary:     Effort: Pulmonary effort is normal. No respiratory distress.  Neurological:     Mental Status: She is alert.            Assessment & Plan:

## 2021-05-17 LAB — NOVEL CORONAVIRUS, NAA: SARS-CoV-2, NAA: NOT DETECTED

## 2021-05-17 LAB — SARS-COV-2, NAA 2 DAY TAT

## 2021-05-18 MED ORDER — BENZONATATE 200 MG PO CAPS: 200.0000 mg | ORAL_CAPSULE | Freq: Three times a day (TID) | ORAL | 0 refills | Status: DC | PRN

## 2021-05-18 NOTE — Addendum Note (Signed)
Addended by: Viviana Simpler I on: 05/18/2021 03:19 PM   Modules accepted: Orders

## 2021-09-09 ENCOUNTER — Telehealth: Payer: Self-pay

## 2021-09-09 NOTE — Telephone Encounter (Signed)
Patient is due for her colonoscopy. She will call Dr Dwyane Luo office to schedule this.

## 2021-09-15 ENCOUNTER — Other Ambulatory Visit: Payer: Self-pay | Admitting: Internal Medicine

## 2021-09-15 DIAGNOSIS — Z1231 Encounter for screening mammogram for malignant neoplasm of breast: Secondary | ICD-10-CM

## 2021-09-24 ENCOUNTER — Other Ambulatory Visit: Payer: Self-pay

## 2021-09-24 ENCOUNTER — Encounter: Payer: Self-pay | Admitting: Internal Medicine

## 2021-09-24 ENCOUNTER — Ambulatory Visit (INDEPENDENT_AMBULATORY_CARE_PROVIDER_SITE_OTHER): Payer: Managed Care, Other (non HMO) | Admitting: Internal Medicine

## 2021-09-24 VITALS — BP 114/80 | HR 60 | Temp 97.6°F | Ht 62.5 in | Wt 142.0 lb

## 2021-09-24 DIAGNOSIS — Z0000 Encounter for general adult medical examination without abnormal findings: Secondary | ICD-10-CM

## 2021-09-24 DIAGNOSIS — J4520 Mild intermittent asthma, uncomplicated: Secondary | ICD-10-CM

## 2021-09-24 DIAGNOSIS — I10 Essential (primary) hypertension: Secondary | ICD-10-CM | POA: Diagnosis not present

## 2021-09-24 DIAGNOSIS — G2581 Restless legs syndrome: Secondary | ICD-10-CM | POA: Diagnosis not present

## 2021-09-24 LAB — CBC
HCT: 45 % (ref 36.0–46.0)
Hemoglobin: 15 g/dL (ref 12.0–15.0)
MCHC: 33.4 g/dL (ref 30.0–36.0)
MCV: 97.9 fl (ref 78.0–100.0)
Platelets: 317 10*3/uL (ref 150.0–400.0)
RBC: 4.59 Mil/uL (ref 3.87–5.11)
RDW: 12.8 % (ref 11.5–15.5)
WBC: 11.6 10*3/uL — ABNORMAL HIGH (ref 4.0–10.5)

## 2021-09-24 LAB — COMPREHENSIVE METABOLIC PANEL
ALT: 18 U/L (ref 0–35)
AST: 19 U/L (ref 0–37)
Albumin: 4.3 g/dL (ref 3.5–5.2)
Alkaline Phosphatase: 86 U/L (ref 39–117)
BUN: 17 mg/dL (ref 6–23)
CO2: 26 mEq/L (ref 19–32)
Calcium: 10.3 mg/dL (ref 8.4–10.5)
Chloride: 101 mEq/L (ref 96–112)
Creatinine, Ser: 0.72 mg/dL (ref 0.40–1.20)
GFR: 89.1 mL/min (ref >60.00)
Glucose, Bld: 101 mg/dL — ABNORMAL HIGH (ref 70–99)
Potassium: 4.1 mEq/L (ref 3.5–5.1)
Sodium: 136 mEq/L (ref 135–145)
Total Bilirubin: 0.7 mg/dL (ref 0.2–1.2)
Total Protein: 7.6 g/dL (ref 6.0–8.3)

## 2021-09-24 MED ORDER — BENZONATATE 200 MG PO CAPS: 200.0000 mg | ORAL_CAPSULE | Freq: Three times a day (TID) | ORAL | 1 refills | Status: DC | PRN

## 2021-09-24 NOTE — Assessment & Plan Note (Signed)
BP Readings from Last 3 Encounters:  09/24/21 114/80  09/19/20 122/82  09/04/19 124/88   Good control on amlodipine and losartan Will check labs

## 2021-09-24 NOTE — Assessment & Plan Note (Addendum)
Healthy but needs to work on fitness Colonoscopy will be soon Mammogram in December Last pap in 2-3 years Will get flu vaccine and bivalent COVID Urged her to stop all cigarettes

## 2021-09-24 NOTE — Progress Notes (Signed)
Subjective:    Patient ID: Brandy Hamilton, female    DOB: [birth year], 65 y.o.   MRN: [id number]  HPI Here for physical This visit occurred during the SARS-CoV-2 public health emergency.  Safety protocols were in place, including screening questions prior to the visit, additional usage of staff PPE, and extensive cleaning of exam room while observing appropriate contact time as indicated for disinfecting solutions.   Doing okay  Some rectal bleeding in bowl--nothing new. Is setting up colonoscopy that is due (because of polyp) Mammogram is scheduled for December No regular exercise---does walk some Just manages a couple of properties Only gained back a couple of pounds  Current Outpatient Medications on File Prior to Visit  Medication Sig Dispense Refill   amLODipine (NORVASC) 10 MG tablet TAKE 1 TABLET DAILY 90 tablet 3   benzonatate (TESSALON) 200 MG capsule Take 1 capsule (200 mg total) by mouth 3 (three) times daily as needed for cough. 60 capsule 0   bisoprolol-hydrochlorothiazide (ZIAC) 10-6.25 MG tablet TAKE 1 TABLET DAILY 90 tablet 3   Calcium Carbonate (CALCIUM 500 PO) Take by mouth daily.     cetirizine (ZYRTEC) 10 MG tablet Take 10 mg by mouth daily.     hyoscyamine (LEVSIN) 0.125 MG tablet Take 1 tablet (0.125 mg total) by mouth 3 (three) times daily as needed. Before meals 30 tablet 0   levalbuterol (XOPENEX HFA) 45 MCG/ACT inhaler Inhale 1-2 puffs into the lungs every 6 (six) hours as needed. 1 each 1   losartan (COZAAR) 100 MG tablet TAKE 1 TABLET DAILY 90 tablet 3   montelukast (SINGULAIR) 10 MG tablet TAKE 1 TABLET AT BEDTIME 90 tablet 3   Potassium 75 MG TABS Take by mouth daily.     rOPINIRole (REQUIP) 0.5 MG tablet TAKE 1 TABLET AT BEDTIME 90 tablet 3   No current facility-administered medications on file prior to visit.    Allergies  Allergen Reactions   Beclomethasone Dipropionate Other (See Comments)    Patient unsure of reaction.   Levofloxacin Other  (See Comments)    Patient unsure of reaction.    Ramipril     REACTION: Cough    Past Medical History:  Diagnosis Date   Allergy    pollen, seasonal, perfumes, medications   Asthma    Breast cancer ([place]) 2008   radiation   Cancer ([place]) 11/07/2007   left partial mastectomy done 12/12/2007-DCIS ER/PR neg,positive margins identified to re-excisione done    DCIS (ductal carcinoma in situ) of breast    partial mastectomy and RT-----------------Dr Byrnett   Hyperlipidemia    Hypertension    1998   Personal history of tobacco use, presenting hazards to health    Special screening for malignant neoplasms, colon     Past Surgical History:  Procedure Laterality Date   BELPHAROPTOSIS REPAIR Bilateral 01/2017   BREAST BIOPSY Left    1998    Breast biopsy negative - Byrnett, 3/00     Left breast biopsy- stereotactic10/05   Left breast biopsy - (-) Byrnett11/08   Left breast biopsy--L partial mastectomy for DCIS, ,    BREAST EXCISIONAL BIOPSY Left 2008   positive   BREAST SURGERY Left 11/07/2007   3 cm area of DCIS, resected the negative margins. PR: 0%; ER less than 5%. Treated by whole breast radiation, patient declined antiestrogen therapy.   COLONOSCOPY  2009   hyperplastic polyp from rectum, Dr. Bary Castilla   COLONOSCOPY WITH PROPOFOL N/A 08/31/2018   Procedure: COLONOSCOPY WITH  PROPOFOL;  Surgeon: Robert Bellow, MD;  Location: [facility] ENDOSCOPY;  Service: Endoscopy;  Laterality: N/A;    Family History  Problem Relation Age of Onset   Cancer Maternal Grandfather    Cancer Other        breast cancer   Heart disease Mother    Hypertension Mother    Breast cancer Neg Hx     Social History   Socioeconomic History   Marital status: Divorced    Spouse name: Not on file   Number of children: 0   Years of education: Not on file   Highest education level: Not on file  Occupational History   Occupation: [employer]: retired   Occupation:  Engineer, petroleum, taxes---part time    Comment:     Occupation: Rental properties   Occupation:    Tobacco Use   Smoking status: Some Days    Packs/day: 0.25    Years: 5.00    Pack years: 1.25    Types: Cigarettes   Smokeless tobacco: Never   Tobacco comments:    discussed cold [place] stopping since not an everyday smoker  Vaping Use   Vaping Use: Never used  Substance and Sexual Activity   Alcohol use: Yes    Comment: drinks rarely   Drug use: No   Sexual activity: Not on file  Other Topics Concern   Not on file  Social History Narrative   Not on file   Social Determinants of Health   Financial Resource Strain: Not on file  Food Insecurity: Not on file  Transportation Needs: Not on file  Physical Activity: Not on file  Stress: Not on file  Social Connections: Not on file  Intimate Partner Violence: Not on file   Review of Systems  Constitutional:  Negative for fatigue and unexpected weight change.       Wears seat belt Still smoking but not every day  HENT:  Negative for dental problem, hearing loss, tinnitus and trouble swallowing.        Keeps up with dentist  Eyes:  Negative for visual disturbance.       No diplopia or unilateral vision loss  Respiratory:  Positive for shortness of breath and wheezing. Negative for chest tightness.        Regular cough--thought it was allergies Uses xopenex rarely  Cardiovascular:  Negative for chest pain, palpitations and leg swelling.  Gastrointestinal:  Positive for anal bleeding. Negative for constipation.       Rare heartburn--tums helps  Endocrine: Negative for polydipsia and polyuria.  Genitourinary:  Negative for dyspareunia, dysuria and frequency.  Musculoskeletal:  Negative for arthralgias, back pain and joint swelling.  Skin:  Negative for rash.  Allergic/Immunologic: Positive for environmental allergies. Negative for immunocompromised state.       On cetirizine/singulair  Neurological:  Negative for dizziness, syncope  and light-headedness.       Rare mild headaches  Hematological:  Negative for adenopathy. Does not bruise/bleed easily.  Psychiatric/Behavioral:  Negative for dysphoric mood. The patient is not nervous/anxious.        Poor sleep ---- okay in day but not good at night      Objective:   Physical Exam Constitutional:      Appearance: Normal appearance.  HENT:     Right Ear: Tympanic membrane and ear canal normal.     Left Ear: Tympanic membrane and ear canal normal.     Mouth/Throat:  Pharynx: No oropharyngeal exudate or posterior oropharyngeal erythema.  Eyes:     Conjunctiva/sclera: Conjunctivae normal.     Pupils: Pupils are equal, round, and reactive to light.  Cardiovascular:     Rate and Rhythm: Normal rate and regular rhythm.     Pulses: Normal pulses.     Heart sounds: No murmur heard.   No gallop.  Pulmonary:     Effort: Pulmonary effort is normal.     Breath sounds: Normal breath sounds. No wheezing or rales.  Abdominal:     Palpations: Abdomen is soft.     Tenderness: There is no abdominal tenderness.  Musculoskeletal:     Cervical back: Neck supple.     Right lower leg: No edema.     Left lower leg: No edema.  Lymphadenopathy:     Cervical: No cervical adenopathy.  Skin:    Findings: No lesion or rash.  Neurological:     General: No focal deficit present.     Mental Status: She is alert and oriented to person, place, and time.  Psychiatric:        Mood and Affect: Mood normal.        Behavior: Behavior normal.           Assessment & Plan:

## 2021-09-24 NOTE — Assessment & Plan Note (Signed)
Not sure ropinirole is helping---asked her to try holding it for a while

## 2021-09-24 NOTE — Assessment & Plan Note (Signed)
Okay on singulair Discussed the cigarettes---try just quitting or nicotine replacement

## 2021-10-07 ENCOUNTER — Other Ambulatory Visit: Payer: Self-pay | Admitting: General Surgery

## 2021-10-08 NOTE — Progress Notes (Signed)
Subjective:     Patient ID: Brandy Hamilton is a 65 y.o. female.   HPI   The following portions of the patient's history were reviewed and updated as appropriate.   This an established patient is here today for: office visit. She is here to discuss having a colonoscopy, last completed in 2019.   She states her bowels move daily. She has noticed occasional bright blood in the toilet at random times, last episode was 1 month ago.    Review of Systems  Constitutional: Negative for chills and fever.  Respiratory: Negative for cough.          Chief Complaint  Patient presents with   Pre-op Exam      BP 128/62   Pulse 71   Temp 36.8 C (98.3 F)   Ht 158.8 cm (5' 2.5")   Wt 65.8 kg (145 lb)   SpO2 97%   BMI 26.10 kg/m        Past Medical History:  Diagnosis Date   Asthma     Breast cancer ([facility]) 11/07/2007    DCIS   Hyperlipidemia     Hypertension             Past Surgical History:  Procedure Laterality Date   belpharoptosis Bilateral 2018   BREAST EXCISIONAL BIOPSY Left 11/07/2007   COLONOSCOPY   08/31/2018   COLONOSCOPY   2009                OB History     Gravida  0   Para  0   Term  0   Preterm  0   AB  0   Living  0      SAB  0   IAB  0   Ectopic  0   Molar  0   Multiple  0   Live Births  0        Obstetric Comments  Age at first period 36               Social History           Socioeconomic History   Marital status: Single  Tobacco Use   Smoking status: Current Every Day Smoker      Packs/day: 0.25      Years: 5.00      Pack years: 1.25      Types: Cigarettes   Smokeless tobacco: Never Used  Substance and Sexual Activity   Alcohol use: Yes   Drug use: Never            Allergies  Allergen Reactions   Beclomethasone Other (See Comments)   Levofloxacin Other (See Comments)   Ramipril Cough      Current Medications        Current Outpatient Medications  Medication Sig Dispense Refill   amLODIPine  (NORVASC) 10 MG tablet Take 1 tablet by mouth once daily       cetirizine (ZYRTEC) 10 MG tablet Take 10 mg by mouth once daily       hyoscyamine (LEVSIN) 0.125 mg tablet Take 0.125 mg by mouth every 4 (four) hours as needed       levalbuterol (XOPENEX HFA) inhaler Inhale into the lungs as directed       losartan (COZAAR) 100 MG tablet Take 1 tablet by mouth once daily       montelukast (SINGULAIR) 10 mg tablet Take 1 tablet by mouth at bedtime       rOPINIRole (REQUIP) 0.5  MG tablet at bedtime        No current facility-administered medications for this visit.             Family History  Problem Relation Age of Onset   High blood pressure (Hypertension) Mother     Heart disease Mother     Cancer Maternal Grandfather     Colon cancer Cousin 31        maternal   Breast cancer Neg Hx          Labs and Radiology:    Colonoscopy exam August 31, 2018:   A 10 mm polyp was found in the cecum. The polyp was sessile. The polyp was removed with a hot snare. Resection and retrieval were complete. Findings: A 8 mm polyp was found in the hepatic flexure. The polyp was sessile. The polyp was removed with a hot snare. Resection and retrieval were complete. A 7 mm polyp was found in the rectum. The polyp was sessile. The polyp was removed with a hot snare. Resection and retrieval were complete.     Pathology August 31, 2018:   DIAGNOSIS:  A.  COLON POLYP, CECUM; HOT SNARE:  - TUBULAR ADENOMA, MULTIPLE FRAGMENTS.  - NEGATIVE FOR HIGH-GRADE DYSPLASIA AND MALIGNANCY.    B.  COLON POLYP, HEPATIC FLEXURE; HOT SNARE:  - TUBULAR ADENOMA.  - NEGATIVE FOR HIGH-GRADE DYSPLASIA AND MALIGNANCY.    C.  RECTUM POLYP; HOT SNARE:  - HYPERPLASTIC POLYP.  - NEGATIVE FOR DYSPLASIA AND MALIGNANCY.         Objective:   Physical Exam Constitutional:      Appearance: Normal appearance.  Cardiovascular:     Rate and Rhythm: Normal rate and regular rhythm.     Pulses: Normal pulses.      Heart sounds: Normal heart sounds.  Pulmonary:     Effort: Pulmonary effort is normal.     Breath sounds: Normal breath sounds.  Musculoskeletal:     Cervical back: Neck supple.  Skin:    General: Skin is warm and dry.  Neurological:     Mental Status: She is alert and oriented to person, place, and time.  Psychiatric:        Mood and Affect: Mood normal.        Behavior: Behavior normal.           Assessment:     Candidate for repeat colonoscopy.    Plan:     Indications for the procedure were reviewed.  Preparation by the staff reviewed.      This note is partially prepared by Karie Fetch, RN, acting as a scribe in the presence of Dr. Hervey Ard, MD.  The documentation recorded by the scribe accurately reflects the service I personally performed and the decisions made by me.    Robert Bellow, MD FACS

## 2021-10-28 ENCOUNTER — Encounter: Payer: Self-pay | Admitting: General Surgery

## 2021-10-29 ENCOUNTER — Ambulatory Visit: Payer: Managed Care, Other (non HMO) | Admitting: Anesthesiology

## 2021-10-29 ENCOUNTER — Encounter: Payer: Self-pay | Admitting: General Surgery

## 2021-10-29 ENCOUNTER — Encounter
Admission: RE | Disposition: A | Discharge status: Home / Self Care | Payer: Self-pay | Source: Home / Self Care | Attending: General Surgery

## 2021-10-29 ENCOUNTER — Ambulatory Visit
Admission: RE | Admit: 2021-10-29 | Discharge: 2021-10-29 | Disposition: A | Discharge status: Home / Self Care | Payer: Managed Care, Other (non HMO) | Attending: General Surgery | Admitting: General Surgery

## 2021-10-29 DIAGNOSIS — I10 Essential (primary) hypertension: Secondary | ICD-10-CM | POA: Diagnosis not present

## 2021-10-29 DIAGNOSIS — D120 Benign neoplasm of cecum: Secondary | ICD-10-CM | POA: Diagnosis not present

## 2021-10-29 DIAGNOSIS — J449 Chronic obstructive pulmonary disease, unspecified: Secondary | ICD-10-CM | POA: Insufficient documentation

## 2021-10-29 DIAGNOSIS — D124 Benign neoplasm of descending colon: Secondary | ICD-10-CM | POA: Insufficient documentation

## 2021-10-29 DIAGNOSIS — Q438 Other specified congenital malformations of intestine: Secondary | ICD-10-CM | POA: Diagnosis not present

## 2021-10-29 DIAGNOSIS — D125 Benign neoplasm of sigmoid colon: Secondary | ICD-10-CM | POA: Diagnosis not present

## 2021-10-29 DIAGNOSIS — K5730 Diverticulosis of large intestine without perforation or abscess without bleeding: Secondary | ICD-10-CM | POA: Insufficient documentation

## 2021-10-29 DIAGNOSIS — K621 Rectal polyp: Secondary | ICD-10-CM | POA: Diagnosis not present

## 2021-10-29 DIAGNOSIS — Z853 Personal history of malignant neoplasm of breast: Secondary | ICD-10-CM | POA: Diagnosis not present

## 2021-10-29 DIAGNOSIS — F17210 Nicotine dependence, cigarettes, uncomplicated: Secondary | ICD-10-CM | POA: Diagnosis not present

## 2021-10-29 DIAGNOSIS — Z09 Encounter for follow-up examination after completed treatment for conditions other than malignant neoplasm: Secondary | ICD-10-CM | POA: Diagnosis present

## 2021-10-29 HISTORY — PX: COLONOSCOPY WITH PROPOFOL: SHX5780

## 2021-10-29 SURGERY — COLONOSCOPY WITH PROPOFOL
Anesthesia: General

## 2021-10-29 MED ORDER — PROPOFOL 10 MG/ML IV BOLUS
INTRAVENOUS | Status: DC | PRN
  Administered 2021-10-29: 60 mg via INTRAVENOUS
  Administered 2021-10-29 (×2): 20 mg via INTRAVENOUS

## 2021-10-29 MED ORDER — SODIUM CHLORIDE 0.9 % IV SOLN: INTRAVENOUS | Status: DC

## 2021-10-29 MED ORDER — PROPOFOL 500 MG/50ML IV EMUL
INTRAVENOUS | Status: DC | PRN
  Administered 2021-10-29: 150 ug/kg/min via INTRAVENOUS

## 2021-10-29 MED ORDER — LIDOCAINE HCL (CARDIAC) PF 100 MG/5ML IV SOSY
PREFILLED_SYRINGE | INTRAVENOUS | Status: DC | PRN
  Administered 2021-10-29: 100 mg via INTRAVENOUS

## 2021-10-29 NOTE — H&P (Signed)
Brandy Hamilton [id number] January 19, 1956    HPI:  Healthy woman with history of colonic polyps in 2019. For follow up study. Tolerated prep well.   Medications Prior to Admission  Medication Sig Dispense Refill Last Dose   amLODipine (NORVASC) 10 MG tablet TAKE 1 TABLET DAILY 90 tablet 3 10/28/2021   bisoprolol-hydrochlorothiazide (ZIAC) 10-6.25 MG tablet TAKE 1 TABLET DAILY 90 tablet 3 10/28/2021   cetirizine (ZYRTEC) 10 MG tablet Take 10 mg by mouth daily.   10/28/2021   hyoscyamine (LEVSIN) 0.125 MG tablet Take 1 tablet (0.125 mg total) by mouth 3 (three) times daily as needed. Before meals 30 tablet 0 10/28/2021   losartan (COZAAR) 100 MG tablet TAKE 1 TABLET DAILY 90 tablet 3 10/28/2021   montelukast (SINGULAIR) 10 MG tablet TAKE 1 TABLET AT BEDTIME 90 tablet 3 10/28/2021   Potassium 75 MG TABS Take by mouth daily.   10/28/2021   rOPINIRole (REQUIP) 0.5 MG tablet TAKE 1 TABLET AT BEDTIME 90 tablet 3 10/28/2021   benzonatate (TESSALON) 200 MG capsule Take 1 capsule (200 mg total) by mouth 3 (three) times daily as needed for cough. 60 capsule 1    Calcium Carbonate (CALCIUM 500 PO) Take by mouth daily.      levalbuterol (XOPENEX HFA) 45 MCG/ACT inhaler Inhale 1-2 puffs into the lungs every 6 (six) hours as needed. 1 each 1    Allergies  Allergen Reactions   Beclomethasone Dipropionate Other (See Comments)    Patient unsure of reaction.   Levofloxacin Other (See Comments)    Patient unsure of reaction.    Ramipril     REACTION: Cough   Past Medical History:  Diagnosis Date   Allergy    pollen, seasonal, perfumes, medications   Asthma    Breast cancer ([place]) 2008   radiation   Cancer ([place]) 11/07/2007   left partial mastectomy done 12/12/2007-DCIS ER/PR neg,positive margins identified to re-excisione done    DCIS (ductal carcinoma in situ) of breast    partial mastectomy and RT-----------------Dr Brandy Hamilton   Hyperlipidemia    Hypertension    1998   Personal history of tobacco use,  presenting hazards to health    Special screening for malignant neoplasms, colon    Past Surgical History:  Procedure Laterality Date   BELPHAROPTOSIS REPAIR Bilateral 01/2017   BREAST BIOPSY Left    1998    Breast biopsy negative - Brandy Hamilton, 3/00     Left breast biopsy- stereotactic10/05   Left breast biopsy - (-) Byrnett11/08   Left breast biopsy--L partial mastectomy for DCIS, ,    BREAST EXCISIONAL BIOPSY Left 2008   positive   BREAST SURGERY Left 11/07/2007   3 cm area of DCIS, resected the negative margins. PR: 0%; ER less than 5%. Treated by whole breast radiation, patient declined antiestrogen therapy.   COLONOSCOPY  2009   hyperplastic polyp from rectum, Dr. Bary Hamilton   COLONOSCOPY WITH PROPOFOL N/A 08/31/2018   Procedure: COLONOSCOPY WITH PROPOFOL;  Surgeon: Brandy Bellow, MD;  Location: [facility] ENDOSCOPY;  Service: Endoscopy;  Laterality: N/A;   Social History   Socioeconomic History   Marital status: Divorced    Spouse name: Not on file   Number of children: 0   Years of education: Not on file   Highest education level: Not on file  Occupational History   Occupation: [place]: retired   Occupation: Engineer, petroleum, taxes---part time    Comment:     Occupation: Tour manager  properties   Occupation:    Tobacco Use   Smoking status: Every Day    Packs/day: 0.25    Years: 5.00    Pack years: 1.25    Types: Cigarettes   Smokeless tobacco: Never   Tobacco comments:    discussed cold [place] stopping since not an everyday smoker  Vaping Use   Vaping Use: Never used  Substance and Sexual Activity   Alcohol use: Yes    Comment: drinks rarely   Drug use: Never   Sexual activity: Not on file  Other Topics Concern   Not on file  Social History Narrative   Not on file   Social Determinants of Health   Financial Resource Strain: Not on file  Food Insecurity: Not on file  Transportation Needs: Not on file  Physical Activity: Not on file   Stress: Not on file  Social Connections: Not on file  Intimate Partner Violence: Not on file   Social History   Social History Narrative   Not on file     ROS: Negative.     Colonoscopy exam August 31, 2018:   A 10 mm polyp was found in the cecum. The polyp was sessile. The polyp was removed with a hot snare. Resection and retrieval were complete. Findings: A 8 mm polyp was found in the hepatic flexure. The polyp was sessile. The polyp was removed with a hot snare. Resection and retrieval were complete. A 7 mm polyp was found in the rectum. The polyp was sessile. The polyp was removed with a hot snare. Resection and retrieval were complete.     Pathology August 31, 2018:   DIAGNOSIS:  A.  COLON POLYP, CECUM; HOT SNARE:  - TUBULAR ADENOMA, MULTIPLE FRAGMENTS.  - NEGATIVE FOR HIGH-GRADE DYSPLASIA AND MALIGNANCY.    B.  COLON POLYP, HEPATIC FLEXURE; HOT SNARE:  - TUBULAR ADENOMA.  - NEGATIVE FOR HIGH-GRADE DYSPLASIA AND MALIGNANCY.    C.  RECTUM POLYP; HOT SNARE:  - HYPERPLASTIC POLYP.  - NEGATIVE FOR DYSPLASIA AND MALIGNANCY.       PE: HEENT: Negative. Lungs: Clear. Cardio: RR.   Assessment/Plan:  Proceed with planned endoscopy.   Brandy Hamilton [facility] 10/29/2021

## 2021-10-29 NOTE — Anesthesia Preprocedure Evaluation (Addendum)
Anesthesia Evaluation  Patient identified by MRN, date of birth, ID band Patient awake    Reviewed: Allergy & Precautions, NPO status , Patient's Chart, lab work & pertinent test results  History of Anesthesia Complications Negative for: history of anesthetic complications  Airway Mallampati: III  TM Distance: <3 FB Neck ROM: Full  Mouth opening: Limited Mouth Opening  Dental   Pulmonary asthma , COPD, Current SmokerPatient did not abstain from smoking.,    Pulmonary exam normal        Cardiovascular hypertension, Normal cardiovascular exam     Neuro/Psych negative neurological ROS  negative psych ROS   GI/Hepatic negative GI ROS, Neg liver ROS,   Endo/Other  negative endocrine ROS  Renal/GU negative Renal ROS     Musculoskeletal   Abdominal   Peds  Hematology   Breast cancer 2008   Anesthesia Other Findings EKG 2019 Undetermined rhythm ST & T wave abnormality, consider anterior ischemia  Reproductive/Obstetrics                            Anesthesia Physical Anesthesia Plan  ASA: 3  Anesthesia Plan: General   Post-op Pain Management:    Induction:   PONV Risk Score and Plan:   Airway Management Planned:   Additional Equipment:   Intra-op Plan:   Post-operative Plan:   Informed Consent: I have reviewed the patients History and Physical, chart, labs and discussed the procedure including the risks, benefits and alternatives for the proposed anesthesia with the patient or authorized representative who has indicated his/her understanding and acceptance.       Plan Discussed with: CRNA  Anesthesia Plan Comments: (She did not take her BP meds this morning)        Anesthesia Quick Evaluation

## 2021-10-29 NOTE — Anesthesia Postprocedure Evaluation (Signed)
Anesthesia Post Note  Patient: Brandy Hamilton  Procedure(s) Performed: COLONOSCOPY WITH PROPOFOL  Patient location during evaluation: PACU Anesthesia Type: General Level of consciousness: awake and alert Pain management: pain level controlled Vital Signs Assessment: post-procedure vital signs reviewed and stable Respiratory status: spontaneous breathing, nonlabored ventilation, respiratory function stable and patient connected to nasal cannula oxygen Cardiovascular status: blood pressure returned to baseline and stable Postop Assessment: no apparent nausea or vomiting Anesthetic complications: no   No notable events documented.   Last Vitals:  Vitals:   10/29/21 1119 10/29/21 1129  BP: (!) 126/91 (!) 148/85  Pulse: 66 61  Resp: (!) 21 (!) 31  Temp:    SpO2: 98% 99%    Last Pain:  Vitals:   10/29/21 1129  TempSrc:   PainSc: 0-No pain                 [place]

## 2021-10-29 NOTE — Transfer of Care (Signed)
Immediate Anesthesia Transfer of Care Note  Patient: Brandy Hamilton  Procedure(s) Performed: COLONOSCOPY WITH PROPOFOL  Patient Location: Endoscopy Unit  Anesthesia Type:General  Level of Consciousness: drowsy  Airway & Oxygen Therapy: Patient Spontanous Breathing  Post-op Assessment: Report given to RN and Post -op Vital signs reviewed and stable  Post vital signs: Reviewed and stable  Last Vitals:  Vitals Value Taken Time  BP 101/61 10/29/21 1110  Temp    Pulse 69 10/29/21 1110  Resp 12 10/29/21 1110  SpO2 96 % 10/29/21 1110  Vitals shown include unvalidated device data.  Last Pain:  Vitals:   10/29/21 0952  TempSrc: Temporal  PainSc: 2          Complications: No notable events documented.

## 2021-10-29 NOTE — Op Note (Signed)
[facility] Gastroenterology Patient Name: Brandy Hamilton Procedure Date: 10/29/2021 10:29 AM MRN: [id number] Account #: [id number] Date of Birth: 08-27-1956 Admit Type: Outpatient Age: 65 Room: [facility] ENDO ROOM 1 Gender: Female Note Status: Finalized Instrument Name: Peds Colonoscope [id number] Procedure:             Colonoscopy Indications:           High risk colon cancer surveillance: Personal history                         of colonic polyps Providers:             Robert Bellow, MD Referring MD:          Venia Carbon (Referring MD) Medicines:             Propofol per Anesthesia Complications:         No immediate complications. Procedure:             Pre-Anesthesia Assessment:                        - Prior to the procedure, a History and Physical was                         performed, and patient medications, allergies and                         sensitivities were reviewed. The patient's tolerance                         of previous anesthesia was reviewed.                        - The risks and benefits of the procedure and the                         sedation options and risks were discussed with the                         patient. All questions were answered and informed                         consent was obtained.                        After obtaining informed consent, the colonoscope was                         passed under direct vision. Throughout the procedure,                         the patient's blood pressure, pulse, and oxygen                         saturations were monitored continuously. The                         Colonoscope was introduced through the anus and                         advanced  to the the cecum, identified by appendiceal                         orifice and ileocecal valve. The colonoscopy was                         somewhat difficult due to multiple diverticula in the                         colon and significant  looping. Successful completion                         of the procedure was aided by using manual pressure.                         The patient tolerated the procedure well. The quality                         of the bowel preparation was excellent. Findings:      Five sessile polyps were found in the rectum, sigmoid colon, descending       colon and cecum. The polyps were 5 to 10 mm in size. These polyps were       removed with a hot snare. Resection and retrieval were complete.      The retroflexed view of the distal rectum and anal verge was normal and       showed no anal or rectal abnormalities. Impression:            - Five 5 to 10 mm polyps in the rectum, in the sigmoid                         colon, in the descending colon and in the cecum,                         removed with a hot snare. Resected and retrieved.                        - The distal rectum and anal verge are normal on                         retroflexion view. Recommendation:        - Telephone endoscopist for pathology results in 1                         week. Procedure Code(s):     --- Professional ---                        [phone], Colonoscopy, flexible; with removal of                         tumor(s), polyp(s), or other lesion(s) by snare                         technique Diagnosis Code(s):     --- Professional ---                        Z86.010, Personal history of  colonic polyps                        K62.1, Rectal polyp                        K63.5, Polyp of colon CPT copyright 2019 American Medical Association. All rights reserved. The codes documented in this report are preliminary and upon coder review may  be revised to meet current compliance requirements. Robert Bellow, MD 10/29/2021 11:09:25 AM This report has been signed electronically. Number of Addenda: 0 Note Initiated On: 10/29/2021 10:29 AM Scope Withdrawal Time: 0 hours 18 minutes 34 seconds  Total Procedure Duration: 0 hours 27  minutes 10 seconds  Estimated Blood Loss:  Estimated blood loss: none.      [facility]

## 2021-10-30 LAB — SURGICAL PATHOLOGY

## 2021-12-02 ENCOUNTER — Telehealth: Payer: Self-pay | Admitting: Internal Medicine

## 2021-12-02 NOTE — Telephone Encounter (Signed)
She should have a refill left. Rxs for all meds sent 04-01-21 #90 and 3 refills.

## 2021-12-02 NOTE — Telephone Encounter (Signed)
Brandy Hamilton from [place] has called on behalf of the patient being due for refills.  amLODipine (NORVASC) 10 MG tablet  bisoprolol-hydrochlorothiazide (ZIAC) 10-6.25 MG tablet  losartan (COZAAR) 100 MG tablet  montelukast (SINGULAIR) 10 MG tablet  rOPINIRole (REQUIP) 0.5 MG tablet  All prescriptions sent to [place], [place]

## 2021-12-04 ENCOUNTER — Ambulatory Visit
Admission: RE | Admit: 2021-12-04 | Discharge: 2021-12-04 | Disposition: A | Discharge status: Home / Self Care | Payer: Managed Care, Other (non HMO) | Source: Ambulatory Visit | Attending: Internal Medicine | Admitting: Internal Medicine

## 2021-12-04 ENCOUNTER — Other Ambulatory Visit: Payer: Self-pay

## 2021-12-04 DIAGNOSIS — Z1231 Encounter for screening mammogram for malignant neoplasm of breast: Secondary | ICD-10-CM | POA: Diagnosis not present

## 2022-03-31 ENCOUNTER — Other Ambulatory Visit: Payer: Self-pay | Admitting: Internal Medicine

## 2022-03-31 NOTE — Telephone Encounter (Signed)
Refill request Requip ?Last refill 04/01/21 #90/3 ?Last office visit 09/24/21 ?Upcoming appointment 09/28/22 ?

## 2022-07-20 ENCOUNTER — Telehealth: Payer: Self-pay

## 2022-07-20 MED ORDER — CETIRIZINE HCL 10 MG PO TABS: 10.0000 mg | ORAL_TABLET | Freq: Every day | ORAL | 0 refills | Status: DC

## 2022-07-20 NOTE — Telephone Encounter (Addendum)
MEDICATION:cetirizine (ZYRTEC) 10 MG tablet  PHARMACY: [id number]  Comments: Patient was advised to do prescription for 90 days.    **Let patient know to contact pharmacy at the end of the day to make sure medication is ready. **  ** Please notify patient to allow 48-72 hours to process**  **Encourage patient to contact the pharmacy for refills or they can request refills through [facility]* .

## 2022-07-20 NOTE — Telephone Encounter (Signed)
Rx sent electronically.  

## 2022-09-16 ENCOUNTER — Other Ambulatory Visit: Payer: Self-pay | Admitting: Internal Medicine

## 2022-09-16 DIAGNOSIS — Z1231 Encounter for screening mammogram for malignant neoplasm of breast: Secondary | ICD-10-CM

## 2022-09-28 ENCOUNTER — Encounter: Payer: Managed Care, Other (non HMO) | Admitting: Internal Medicine

## 2022-10-08 ENCOUNTER — Encounter: Payer: Self-pay | Admitting: Internal Medicine

## 2022-10-08 ENCOUNTER — Ambulatory Visit (INDEPENDENT_AMBULATORY_CARE_PROVIDER_SITE_OTHER): Payer: Managed Care, Other (non HMO) | Admitting: Internal Medicine

## 2022-10-08 VITALS — BP 124/80 | HR 50 | Temp 97.2°F | Ht 62.5 in | Wt 148.0 lb

## 2022-10-08 DIAGNOSIS — I10 Essential (primary) hypertension: Secondary | ICD-10-CM | POA: Diagnosis not present

## 2022-10-08 DIAGNOSIS — J4520 Mild intermittent asthma, uncomplicated: Secondary | ICD-10-CM | POA: Diagnosis not present

## 2022-10-08 DIAGNOSIS — Z0000 Encounter for general adult medical examination without abnormal findings: Secondary | ICD-10-CM | POA: Diagnosis not present

## 2022-10-08 DIAGNOSIS — K580 Irritable bowel syndrome with diarrhea: Secondary | ICD-10-CM

## 2022-10-08 MED ORDER — BENZONATATE 200 MG PO CAPS: 200.0000 mg | ORAL_CAPSULE | Freq: Three times a day (TID) | ORAL | 1 refills | Status: DC | PRN

## 2022-10-08 MED ORDER — HYOSCYAMINE SULFATE 0.125 MG PO TABS: 0.1250 mg | ORAL_TABLET | Freq: Three times a day (TID) | ORAL | 0 refills | Status: DC | PRN

## 2022-10-08 NOTE — Assessment & Plan Note (Addendum)
Healthy Urged her to stop smoking and increase exercise Had flu vaccine---not sure about COVID (recommended) She will consider shingrix Colon due again 2025 Yearly mammogram due to past breast cancer She will consider lung cancer screening next year Last pap next year

## 2022-10-08 NOTE — Assessment & Plan Note (Signed)
Some night cough but otherwise no symptoms On montelukast and has inhaler for rare use

## 2022-10-08 NOTE — Assessment & Plan Note (Signed)
BP Readings from Last 3 Encounters:  10/08/22 124/80  10/29/21 (!) 148/85  09/24/21 114/80   Good control on amlodipine 10, losartan 100 and bisoprolol/HCTZ 10/6.25 Will check labs

## 2022-10-08 NOTE — Progress Notes (Signed)
Subjective:    Patient ID: Brandy Hamilton, female    DOB: August 01, 1956, 66 y.o.   MRN: [id number]  HPI Here for physical  Stressed with the world events--but generally doing okay Some rhinorrhea last week--then scratchy throat 5 days ago Went to sleep and it got better Also took elderberry   Went to eye doctor Diagnosed with cataracts---holding off on surgery Is having trouble with vision Had bad eye infection last May---got antibiotics  Still has coughing --worst when she lies down No SOB Not really exercising Still smoking---discussed  Current Outpatient Medications on File Prior to Visit  Medication Sig Dispense Refill   amLODipine (NORVASC) 10 MG tablet TAKE 1 TABLET DAILY 90 tablet 2   bisoprolol-hydrochlorothiazide (ZIAC) 10-6.25 MG tablet TAKE 1 TABLET DAILY 90 tablet 2   Calcium Carbonate (CALCIUM 500 PO) Take by mouth daily.     cetirizine (ZYRTEC) 10 MG tablet Take 1 tablet (10 mg total) by mouth daily. 90 tablet 0   levalbuterol (XOPENEX HFA) 45 MCG/ACT inhaler Inhale 1-2 puffs into the lungs every 6 (six) hours as needed. 1 each 1   losartan (COZAAR) 100 MG tablet TAKE 1 TABLET DAILY 90 tablet 2   montelukast (SINGULAIR) 10 MG tablet TAKE 1 TABLET AT BEDTIME 90 tablet 2   Potassium 75 MG TABS Take by mouth daily.     rOPINIRole (REQUIP) 0.5 MG tablet TAKE 1 TABLET AT BEDTIME 90 tablet 3   No current facility-administered medications on file prior to visit.    Allergies  Allergen Reactions   Beclomethasone Dipropionate Other (See Comments)    Patient unsure of reaction.   Levofloxacin Other (See Comments)    Patient unsure of reaction.    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,positive margins identified to re-excisione done    DCIS  (ductal carcinoma in situ) of breast    partial mastectomy and RT-----------------Dr Byrnett   Hyperlipidemia    Hypertension    1998   Personal history of tobacco use, presenting hazards to health    Special screening for malignant neoplasms, colon     Past Surgical History:  Procedure Laterality Date   BELPHAROPTOSIS REPAIR Bilateral 01/2017   BREAST BIOPSY Left    1998    Breast biopsy negative - Byrnett, 3/00     Left breast biopsy- stereotactic10/05   Left breast biopsy - (-) Byrnett11/08   Left breast biopsy--L partial mastectomy for DCIS, ,    BREAST EXCISIONAL BIOPSY Left 2008   positive   BREAST SURGERY Left 11/07/2007   3 cm area of DCIS, resected the negative margins. PR: 0%; ER less than 5%. Treated by whole breast radiation, patient declined antiestrogen therapy.   COLONOSCOPY  2009   hyperplastic polyp from rectum, Dr. Bary Castilla   COLONOSCOPY WITH PROPOFOL N/A 08/31/2018   Procedure: COLONOSCOPY WITH PROPOFOL;  Surgeon: Robert Bellow, MD;  [facility] ENDOSCOPY;  Service: Endoscopy;  Laterality: N/A;   COLONOSCOPY WITH PROPOFOL N/A 10/29/2021   Procedure: COLONOSCOPY WITH PROPOFOL;  Surgeon: Robert Bellow, MD;  Location: [facility] ENDOSCOPY;  Service: Endoscopy;  Laterality: N/A;    Family History  Problem Relation Age of Onset   Cancer Maternal Grandfather    Cancer Other  breast cancer   Heart disease Mother    Hypertension Mother    Breast cancer Neg Hx     Social History   Socioeconomic History   Marital status: Divorced    Spouse name: Not on file   Number of children: 0   Years of education: Not on file   Highest education level: Not on file  Occupational History   Occupation: [place]: retired   Occupation: Engineer, petroleum, taxes---part time    Comment:     Occupation: Rental properties   Occupation:    Tobacco Use   Smoking status: Every Day    Packs/day: 0.25    Years: 5.00    Total pack years:  1.25    Types: Cigarettes    Passive exposure: Never   Smokeless tobacco: Never   Tobacco comments:    discussed cold [place] stopping since not an everyday smoker  Vaping Use   Vaping Use: Never used  Substance and Sexual Activity   Alcohol use: Yes    Comment: drinks rarely   Drug use: Never   Sexual activity: Not on file  Other Topics Concern   Not on file  Social History Narrative   Not on file   Social Determinants of Health   Financial Resource Strain: Not on file  Food Insecurity: Not on file  Transportation Needs: Not on file  Physical Activity: Not on file  Stress: Not on file  Social Connections: Not on file  Intimate Partner Violence: Not on file   Review of Systems  Constitutional:  Negative for fatigue.       Weight up slightly Wears seat belt  HENT:  Negative for dental problem and tinnitus.        Mild hearing loss Teeth okay---partial on top. Sees dentist  Eyes:  Positive for visual disturbance.       Due for cataract surgery  Respiratory:  Positive for cough. Negative for shortness of breath.   Cardiovascular:  Negative for chest pain, palpitations and leg swelling.  Gastrointestinal:  Negative for constipation.       Occasionally gets blood  Endocrine: Negative for polydipsia and polyuria.  Genitourinary:  Negative for dyspareunia, dysuria and hematuria.  Musculoskeletal:  Negative for back pain and joint swelling.       Some hands swelling---but not bad pain  Skin:  Negative for rash.       No suspicious skin lesions  Allergic/Immunologic: Positive for environmental allergies. Negative for immunocompromised state.  Neurological:  Negative for dizziness, syncope and light-headedness.       Occ headaches---uses tylenol  Hematological:  Negative for adenopathy. Does not bruise/bleed easily.  Psychiatric/Behavioral:  Negative for dysphoric mood. The patient is not nervous/anxious.        Doesn't sleep great---RLS seems controlled       Objective:    Physical Exam Constitutional:      Appearance: Normal appearance.  HENT:     Mouth/Throat:     Pharynx: No oropharyngeal exudate or posterior oropharyngeal erythema.  Eyes:     Conjunctiva/sclera: Conjunctivae normal.     Pupils: Pupils are equal, round, and reactive to light.  Cardiovascular:     Rate and Rhythm: Normal rate and regular rhythm.     Pulses: Normal pulses.     Heart sounds: No murmur heard.    No gallop.  Pulmonary:     Effort: Pulmonary effort is normal.     Breath sounds: Normal  breath sounds. No wheezing or rales.  Abdominal:     Palpations: Abdomen is soft.     Tenderness: There is no abdominal tenderness.  Musculoskeletal:     Cervical back: Neck supple.     Right lower leg: No edema.     Left lower leg: No edema.     Comments: DIP and some PIP nodules in hands  Lymphadenopathy:     Cervical: No cervical adenopathy.  Skin:    Findings: No lesion or rash.  Neurological:     General: No focal deficit present.     Mental Status: She is alert and oriented to person, place, and time.  Psychiatric:        Mood and Affect: Mood normal.        Behavior: Behavior normal.            Assessment & Plan:

## 2022-10-08 NOTE — Assessment & Plan Note (Signed)
Uses levsin prn

## 2022-10-09 LAB — COMPREHENSIVE METABOLIC PANEL
ALT: 16 U/L (ref 0–35)
AST: 17 U/L (ref 0–37)
Albumin: 4.6 g/dL (ref 3.5–5.2)
Alkaline Phosphatase: 95 U/L (ref 39–117)
BUN: 20 mg/dL (ref 6–23)
CO2: 22 mEq/L (ref 19–32)
Calcium: 10.6 mg/dL — ABNORMAL HIGH (ref 8.4–10.5)
Chloride: 104 mEq/L (ref 96–112)
Creatinine, Ser: 0.75 mg/dL (ref 0.40–1.20)
GFR: 84.22 mL/min (ref >60.00)
Glucose, Bld: 98 mg/dL (ref 70–99)
Potassium: 4.1 mEq/L (ref 3.5–5.1)
Sodium: 141 mEq/L (ref 135–145)
Total Bilirubin: 0.4 mg/dL (ref 0.2–1.2)
Total Protein: 7.3 g/dL (ref 6.0–8.3)

## 2022-10-09 LAB — CBC
HCT: 43.8 % (ref 36.0–46.0)
Hemoglobin: 14.8 g/dL (ref 12.0–15.0)
MCHC: 33.8 g/dL (ref 30.0–36.0)
MCV: 96.7 fl (ref 78.0–100.0)
Platelets: 268 10*3/uL (ref 150.0–400.0)
RBC: 4.53 Mil/uL (ref 3.87–5.11)
RDW: 13.3 % (ref 11.5–15.5)
WBC: 10.8 10*3/uL — ABNORMAL HIGH (ref 4.0–10.5)

## 2022-10-15 ENCOUNTER — Other Ambulatory Visit: Payer: Self-pay | Admitting: Internal Medicine

## 2022-12-07 ENCOUNTER — Ambulatory Visit
Admission: RE | Admit: 2022-12-07 | Discharge: 2022-12-07 | Disposition: A | Discharge status: Home / Self Care | Payer: Managed Care, Other (non HMO) | Source: Ambulatory Visit | Attending: Internal Medicine | Admitting: Internal Medicine

## 2022-12-07 DIAGNOSIS — Z1231 Encounter for screening mammogram for malignant neoplasm of breast: Secondary | ICD-10-CM | POA: Insufficient documentation

## 2022-12-30 ENCOUNTER — Other Ambulatory Visit: Payer: Self-pay | Admitting: Internal Medicine

## 2023-04-01 ENCOUNTER — Other Ambulatory Visit: Payer: Self-pay | Admitting: Internal Medicine

## 2023-09-06 ENCOUNTER — Telehealth: Payer: Self-pay | Admitting: Internal Medicine

## 2023-09-06 NOTE — Telephone Encounter (Signed)
error 

## 2023-09-13 ENCOUNTER — Telehealth: Payer: Self-pay | Admitting: Internal Medicine

## 2023-09-13 MED ORDER — ROPINIROLE HCL 0.5 MG PO TABS: 0.5000 mg | ORAL_TABLET | Freq: Every day | ORAL | 3 refills | Status: DC

## 2023-09-13 MED ORDER — AMLODIPINE BESYLATE 10 MG PO TABS: 10.0000 mg | ORAL_TABLET | Freq: Every day | ORAL | 3 refills | Status: DC

## 2023-09-13 MED ORDER — BISOPROLOL-HYDROCHLOROTHIAZIDE 10-6.25 MG PO TABS: 1.0000 | ORAL_TABLET | Freq: Every day | ORAL | 3 refills | Status: DC

## 2023-09-13 MED ORDER — LOSARTAN POTASSIUM 100 MG PO TABS: 100.0000 mg | ORAL_TABLET | Freq: Every day | ORAL | 3 refills | Status: DC

## 2023-09-13 NOTE — Telephone Encounter (Signed)
Prescription Request  09/13/2023  LOV: 10/08/2022  What is the name of the medication or equipment?  amLODipine (NORVASC) 10 MG tablet montelukast (SINGULAIR) 10 MG tablet rOPINIRole (REQUIP) 0.5 MG tablet losartan (COZAAR) 100 MG tablet bisoprolol-hydrochlorothiazide (ZIAC) 10-6.25 MG tablet   Have you contacted your pharmacy to request a refill? No   Which pharmacy would you like this sent to?  [facility] - [place], [place] - [facility] Portal to [facility] [address] [place] [address] Phone: [phone] Fax: [phone]    Patient notified that their request is being sent to the clinical staff for review and that they should receive a response within 2 business days.   Please advise at Mobile [phone] (mobile)

## 2023-09-13 NOTE — Telephone Encounter (Signed)
Rxs sent electronically.

## 2023-09-20 ENCOUNTER — Other Ambulatory Visit: Payer: Self-pay

## 2023-09-20 MED ORDER — MONTELUKAST SODIUM 10 MG PO TABS: 10.0000 mg | ORAL_TABLET | Freq: Every day | ORAL | 1 refills | Status: DC

## 2023-09-22 ENCOUNTER — Other Ambulatory Visit: Payer: Self-pay | Admitting: Internal Medicine

## 2023-09-22 DIAGNOSIS — Z1231 Encounter for screening mammogram for malignant neoplasm of breast: Secondary | ICD-10-CM

## 2023-10-11 ENCOUNTER — Other Ambulatory Visit (HOSPITAL_COMMUNITY)
Admission: RE | Admit: 2023-10-11 | Discharge: 2023-10-11 | Disposition: A | Discharge status: Home / Self Care | Payer: Medicare HMO | Source: Ambulatory Visit | Attending: Internal Medicine | Admitting: Internal Medicine

## 2023-10-11 ENCOUNTER — Encounter: Payer: Self-pay | Admitting: Internal Medicine

## 2023-10-11 ENCOUNTER — Ambulatory Visit: Payer: Medicare HMO | Admitting: Internal Medicine

## 2023-10-11 VITALS — BP 100/70 | HR 52 | Temp 98.0°F | Ht 62.5 in | Wt 150.0 lb

## 2023-10-11 DIAGNOSIS — G2581 Restless legs syndrome: Secondary | ICD-10-CM

## 2023-10-11 DIAGNOSIS — Z01419 Encounter for gynecological examination (general) (routine) without abnormal findings: Secondary | ICD-10-CM | POA: Diagnosis present

## 2023-10-11 DIAGNOSIS — F17210 Nicotine dependence, cigarettes, uncomplicated: Secondary | ICD-10-CM

## 2023-10-11 DIAGNOSIS — K580 Irritable bowel syndrome with diarrhea: Secondary | ICD-10-CM | POA: Diagnosis not present

## 2023-10-11 DIAGNOSIS — I10 Essential (primary) hypertension: Secondary | ICD-10-CM

## 2023-10-11 DIAGNOSIS — Z1159 Encounter for screening for other viral diseases: Secondary | ICD-10-CM

## 2023-10-11 DIAGNOSIS — Z0000 Encounter for general adult medical examination without abnormal findings: Secondary | ICD-10-CM

## 2023-10-11 DIAGNOSIS — Z1151 Encounter for screening for human papillomavirus (HPV): Secondary | ICD-10-CM | POA: Insufficient documentation

## 2023-10-11 MED ORDER — LEVALBUTEROL TARTRATE 45 MCG/ACT IN AERO: 1.0000 | INHALATION_SPRAY | Freq: Four times a day (QID) | RESPIRATORY_TRACT | 1 refills | Status: DC | PRN

## 2023-10-11 MED ORDER — HYOSCYAMINE SULFATE 0.125 MG PO TABS: 0.1250 mg | ORAL_TABLET | Freq: Three times a day (TID) | ORAL | 1 refills | Status: AC | PRN

## 2023-10-11 MED ORDER — BENZONATATE 200 MG PO CAPS: 200.0000 mg | ORAL_CAPSULE | Freq: Three times a day (TID) | ORAL | 1 refills | Status: AC | PRN

## 2023-10-11 NOTE — Assessment & Plan Note (Signed)
BP Readings from Last 3 Encounters:  10/11/23 100/70  10/08/22 124/80  10/29/21 (!) 148/85   Controlled on amlodipine 10mg  daily and losartan 100mg  Check labs

## 2023-10-11 NOTE — Addendum Note (Signed)
Addended by: Eual Fines on: 10/11/2023 04:08 PM   Modules accepted: Orders

## 2023-10-11 NOTE — Progress Notes (Signed)
Hearing Screening  Method: Audiometry   500Hz 1000Hz 2000Hz 4000Hz  Right ear 20 20 20 20  Left ear 20 20 20 20   Vision Screening   Right eye Left eye Both eyes  Without correction     With correction 20/25 20/25 20/25    

## 2023-10-11 NOTE — Progress Notes (Signed)
Subjective:    Patient ID: Brandy Hamilton, female    DOB: [birth year], 67 y.o.   MRN: [id number]  HPI Here for Welcome to Medicare visit and follow up of chronic health conditions Reviewed advanced directives Reviewed other doctors---Dr Sakai--general surgeon, Dr Phillis Haggis, Dr Uvaldo Rising, Dr Craig Staggers No hospitalizations or surgery in the past year Vision is fading Hearing is okay--but has noticed some right ear popping No exercise--walks a little Still smokes Occ alcohol--seltzer or beer No falls No depression of note. Not anhedonic Independent with instrumental ADLs No sig memory issues  Has ongoing chronic cough Some irritation in throat Wonders about her sinuses Still smoking--just not ready to quit Not really sick though Wonders about allergies also--uses cetirizine Hasn't used inhaler---old  Dry eyes--- found at [facility]. Wanted that treated before considering cataract repair  No chest pain or sig SOB No dizziness or syncope No edema of note No palpitations  Some IBS issues Had been off fish oil for this--but now back on it for dry eyes She gets some urgency--but able to manage it. Uses the levsin prn  Current Outpatient Medications on File Prior to Visit  Medication Sig Dispense Refill   amLODipine (NORVASC) 10 MG tablet Take 1 tablet (10 mg total) by mouth daily. 90 tablet 3   bisoprolol-hydrochlorothiazide (ZIAC) 10-6.25 MG tablet Take 1 tablet by mouth daily. 90 tablet 3   Calcium Carbonate (CALCIUM 500 PO) Take by mouth daily.     cetirizine (ZYRTEC) 10 MG tablet TAKE 1 TABLET BY MOUTH EVERY DAY 90 tablet 3   losartan (COZAAR) 100 MG tablet Take 1 tablet (100 mg total) by mouth daily. 90 tablet 3   montelukast (SINGULAIR) 10 MG tablet Take 1 tablet (10 mg total) by mouth at bedtime. 90 tablet 1   Omega-3 Fatty Acids (FISH OIL PO) Take by mouth.     Potassium 75 MG TABS Take by mouth daily.     rOPINIRole (REQUIP) 0.5 MG tablet Take 1  tablet (0.5 mg total) by mouth at bedtime. 90 tablet 3   No current facility-administered medications on file prior to visit.    Allergies  Allergen Reactions   Beclomethasone Dipropionate Other (See Comments)    Patient unsure of reaction.   Levofloxacin Other (See Comments)    Patient unsure of reaction.    Ramipril     REACTION: Cough    Past Medical History:  Diagnosis Date   Allergy    pollen, seasonal, perfumes, medications   Asthma    Breast cancer (HCC) 2008   radiation   Cancer (HCC) 11/07/2007   left partial mastectomy done 12/12/2007-DCIS ER/PR neg,positive margins identified to re-excisione done    DCIS (ductal carcinoma in situ) of breast    partial mastectomy and RT-----------------Dr Byrnett   Dry eye    Hyperlipidemia    Hypertension    1998   Personal history of tobacco use, presenting hazards to health    Special screening for malignant neoplasms, colon     Past Surgical History:  Procedure Laterality Date   BELPHAROPTOSIS REPAIR Bilateral 01/2017   BREAST BIOPSY Left    1998    Breast biopsy negative - Byrnett, 3/00     Left breast biopsy- stereotactic10/05   Left breast biopsy - (-) Byrnett11/08   Left breast biopsy--L partial mastectomy for DCIS, ,    BREAST EXCISIONAL BIOPSY Left 2008   positive   BREAST SURGERY Left 11/07/2007   3 cm area of DCIS, resected the  negative margins. PR: 0%; ER less than 5%. Treated by whole breast radiation, patient declined antiestrogen therapy.   COLONOSCOPY  2009   hyperplastic polyp from rectum, Dr. Lemar Livings   COLONOSCOPY WITH PROPOFOL N/A 08/31/2018   Procedure: COLONOSCOPY WITH PROPOFOL;  Surgeon: [name], MD;  Location: [facility] ENDOSCOPY;  Service: Endoscopy;  Laterality: N/A;   COLONOSCOPY WITH PROPOFOL N/A 10/29/2021   Procedure: COLONOSCOPY WITH PROPOFOL;  Surgeon: [name], MD;  Location: [facility] ENDOSCOPY;  Service: Endoscopy;  Laterality: N/A;    Family History  Problem Relation Age of  Onset   Cancer Maternal Grandfather    Cancer Other        breast cancer   Heart disease Mother    Hypertension Mother    Breast cancer Neg Hx     Social History   Socioeconomic History   Marital status: Divorced    Spouse name: Not on file   Number of children: 0   Years of education: Not on file   Highest education level: Not on file  Occupational History   Occupation: Animator Support - [employer]    Comment: retired   Occupation: Recruitment consultant, taxes---part time    Comment:     Occupation: Rental properties   Occupation:    Tobacco Use   Smoking status: Every Day    Current packs/day: 0.25    Average packs/day: 0.3 packs/day for 5.0 years (1.3 ttl pk-yrs)    Types: Cigarettes    Passive exposure: Never   Smokeless tobacco: Never   Tobacco comments:    discussed cold [place] stopping since not an everyday smoker  Vaping Use   Vaping status: Never Used  Substance and Sexual Activity   Alcohol use: Yes    Comment: drinks rarely   Drug use: Never   Sexual activity: Not on file  Other Topics Concern   Not on file  Social History Narrative   Has living will   Requests friend Adella Nissen as health care POA   Would accept resuscitation attempts   No tube feeds   Social Determinants of Health   Financial Resource Strain: Not on file  Food Insecurity: Not on file  Transportation Needs: Not on file  Physical Activity: Not on file  Stress: Not on file  Social Connections: Not on file  Intimate Partner Violence: Not on file   Review of Systems Appetite is fine Weight is stable Sleep issues still--up at night and sleeps later (light 2-8AM). Uses the requip--helps the RLS Wears seat belt Teeth are okay--keeps up with dentist Occasional heartburn--tums helps. No dysphagia Bowels move okay--occ blood No suspicious skin lesions No sig back or joint pains--just "normal aches and pains"    Objective:   Physical Exam Constitutional:      Appearance: Normal  appearance.  HENT:     Mouth/Throat:     Pharynx: No oropharyngeal exudate or posterior oropharyngeal erythema.  Eyes:     Conjunctiva/sclera: Conjunctivae normal.     Pupils: Pupils are equal, round, and reactive to light.  Cardiovascular:     Rate and Rhythm: Normal rate and regular rhythm.     Pulses: Normal pulses.     Heart sounds: No murmur heard.    No gallop.  Pulmonary:     Effort: Pulmonary effort is normal.     Breath sounds: Normal breath sounds. No wheezing or rales.  Abdominal:     Palpations: Abdomen is soft.     Tenderness: There is no abdominal  tenderness.  Genitourinary:    Comments: Normal introitus and cervix Pap done Musculoskeletal:     Cervical back: Neck supple.     Right lower leg: No edema.     Left lower leg: No edema.  Lymphadenopathy:     Cervical: No cervical adenopathy.  Skin:    Findings: No rash.  Neurological:     General: No focal deficit present.     Mental Status: She is alert and oriented to person, place, and time.     Comments: Word naming--- 6/1 minute Recall 3/3  Psychiatric:        Mood and Affect: Mood normal.        Behavior: Behavior normal.            Assessment & Plan:

## 2023-10-11 NOTE — Assessment & Plan Note (Signed)
Uses the hyoscyamine prn

## 2023-10-11 NOTE — Assessment & Plan Note (Signed)
Probably the cause of the cough Discussed cessation Referral to lung cancer screening

## 2023-10-11 NOTE — Assessment & Plan Note (Signed)
Uses the ropinirole at bedtime

## 2023-10-11 NOTE — Assessment & Plan Note (Signed)
I have personally reviewed the Medicare Annual Wellness questionnaire and have noted 1. The patient's medical and social history 2. Their use of alcohol, tobacco or illicit drugs 3. Their current medications and supplements 4. The patient's functional ability including ADL's, fall risks, home safety risks and hearing or visual             impairment. 5. Diet and physical activities 6. Evidence for depression or mood disorders  The patients weight, height, BMI and visual acuity have been recorded in the chart I have made referrals, counseling and provided education to the patient based review of the above and I have provided the pt with a written personalized care plan for preventive services.  I have provided you with a copy of your personalized plan for preventive services. Please take the time to review along with your updated medication list.  Prefers no COVID vaccine Had flu vaccine Doesn't really want shingrix Should get prevnar 20 in the next few years Pap today Yearly mammogram Colon due next year

## 2023-10-12 LAB — LIPID PANEL
Cholesterol: 218 mg/dL — ABNORMAL HIGH (ref 0–200)
HDL: 43.4 mg/dL (ref >39.00)
LDL Cholesterol: 117 mg/dL — ABNORMAL HIGH (ref 0–99)
NonHDL: 174.79
Total CHOL/HDL Ratio: 5
Triglycerides: 291 mg/dL — ABNORMAL HIGH (ref 0.0–149.0)
VLDL: 58.2 mg/dL — ABNORMAL HIGH (ref 0.0–40.0)

## 2023-10-12 LAB — COMPREHENSIVE METABOLIC PANEL
ALT: 18 U/L (ref 0–35)
AST: 19 U/L (ref 0–37)
Albumin: 4.1 g/dL (ref 3.5–5.2)
Alkaline Phosphatase: 85 U/L (ref 39–117)
BUN: 20 mg/dL (ref 6–23)
CO2: 28 meq/L (ref 19–32)
Calcium: 10 mg/dL (ref 8.4–10.5)
Chloride: 103 meq/L (ref 96–112)
Creatinine, Ser: 0.82 mg/dL (ref 0.40–1.20)
GFR: 75.14 mL/min (ref >60.00)
Glucose, Bld: 95 mg/dL (ref 70–99)
Potassium: 3.5 meq/L (ref 3.5–5.1)
Sodium: 140 meq/L (ref 135–145)
Total Bilirubin: 0.5 mg/dL (ref 0.2–1.2)
Total Protein: 7.3 g/dL (ref 6.0–8.3)

## 2023-10-12 LAB — CBC
HCT: 45 % (ref 36.0–46.0)
Hemoglobin: 14.7 g/dL (ref 12.0–15.0)
MCHC: 32.6 g/dL (ref 30.0–36.0)
MCV: 97.9 fL (ref 78.0–100.0)
Platelets: 276 10*3/uL (ref 150.0–400.0)
RBC: 4.59 Mil/uL (ref 3.87–5.11)
RDW: 12.9 % (ref 11.5–15.5)
WBC: 8.6 10*3/uL (ref 4.0–10.5)

## 2023-10-12 LAB — HEPATITIS C ANTIBODY: Hepatitis C Ab: NONREACTIVE

## 2023-10-18 LAB — CYTOLOGY - PAP
Adequacy: ABSENT
Comment: NEGATIVE
Diagnosis: NEGATIVE
High risk HPV: NEGATIVE

## 2023-11-12 ENCOUNTER — Other Ambulatory Visit: Payer: Self-pay

## 2023-11-12 DIAGNOSIS — Z122 Encounter for screening for malignant neoplasm of respiratory organs: Secondary | ICD-10-CM

## 2023-11-12 DIAGNOSIS — F17210 Nicotine dependence, cigarettes, uncomplicated: Secondary | ICD-10-CM

## 2023-11-12 DIAGNOSIS — Z87891 Personal history of nicotine dependence: Secondary | ICD-10-CM

## 2023-11-22 ENCOUNTER — Ambulatory Visit (INDEPENDENT_AMBULATORY_CARE_PROVIDER_SITE_OTHER): Payer: Managed Care, Other (non HMO) | Admitting: Acute Care

## 2023-11-22 DIAGNOSIS — F17210 Nicotine dependence, cigarettes, uncomplicated: Secondary | ICD-10-CM | POA: Diagnosis not present

## 2023-11-22 NOTE — Patient Instructions (Signed)

## 2023-11-22 NOTE — Progress Notes (Addendum)
 Provider Attestation I agree with the documentation of the Shared Decision Making visit,  smoking cessation counseling if appropriate, and verification or eligibility for lung cancer screening as documented by the RN Nurse Navigator.   Lauraine PHEBE Lites, MSN, AGACNP-BC [facility] See Amion for personal pager PCCM on call pager [phone]     Virtual Visit via Telephone Note  I connected with Brandy LITTIE Hamilton on 11/22/23 at 12:00 PM EST by telephone and verified that I am speaking with the correct person using two identifiers.  Location: Patient: Brandy Hamilton Provider: Laneta Speaks, RN   I discussed the limitations, risks, security and privacy concerns of performing an evaluation and management service by telephone and the availability of in person appointments. I also discussed with the patient that there may be a patient responsible charge related to this service. The patient expressed understanding and agreed to proceed.   Shared Decision Making Visit Lung Cancer Screening Program [phone])   Eligibility: Age 67 y.o. Pack Years Smoking History Calculation 24 (# packs/per year x # years smoked) Recent History of coughing up blood  no Unexplained weight loss? no ( >Than 15 pounds within the last 6 months ) Prior History Lung / other cancer no (Diagnosis within the last 5 years already requiring surveillance chest CT Scans). Smoking Status Current Smoker Former Smokers: Years since quit: na  Quit Date: na  Visit Components: Discussion included one or more decision making aids. yes Discussion included risk/benefits of screening. yes Discussion included potential follow up diagnostic testing for abnormal scans. yes Discussion included meaning and risk of over diagnosis. yes Discussion included meaning and risk of False Positives. yes Discussion included meaning of total radiation exposure. yes  Counseling Included: Importance of adherence to  annual lung cancer LDCT screening. yes Impact of comorbidities on ability to participate in the program. yes Ability and willingness to under diagnostic treatment. yes  Smoking Cessation Counseling: Current Smokers:  Discussed importance of smoking cessation. yes Information about tobacco cessation classes and interventions provided to patient. yes Patient provided with ticket for LDCT Scan. no Symptomatic Patient. no  Counseling(Intermediate counseling: > three minutes) 99406 Diagnosis Code: Tobacco Use Z72.0 Asymptomatic Patient yes  Counseling (Intermediate counseling: > three minutes counseling) H9563 Former Smokers:  Discussed the importance of maintaining cigarette abstinence. yes Diagnosis Code: Personal History of Nicotine Dependence. S12.108 Information about tobacco cessation classes and interventions provided to patient. Yes Patient provided with ticket for LDCT Scan. no Written Order for Lung Cancer Screening with LDCT placed in Epic. Yes (CT Chest Lung Cancer Screening Low Dose W/O CM) [id number] Z12.2-Screening of respiratory organs Z87.891-Personal history of nicotine dependence   Laneta Speaks, RN

## 2023-11-24 ENCOUNTER — Ambulatory Visit
Admission: RE | Admit: 2023-11-24 | Discharge: 2023-11-24 | Disposition: A | Discharge status: Home / Self Care | Payer: Medicare HMO | Source: Ambulatory Visit | Attending: Internal Medicine | Admitting: Internal Medicine

## 2023-11-24 DIAGNOSIS — Z122 Encounter for screening for malignant neoplasm of respiratory organs: Secondary | ICD-10-CM

## 2023-11-24 DIAGNOSIS — Z87891 Personal history of nicotine dependence: Secondary | ICD-10-CM

## 2023-11-24 DIAGNOSIS — F17210 Nicotine dependence, cigarettes, uncomplicated: Secondary | ICD-10-CM

## 2023-12-08 ENCOUNTER — Other Ambulatory Visit: Payer: Self-pay

## 2023-12-08 DIAGNOSIS — F17210 Nicotine dependence, cigarettes, uncomplicated: Secondary | ICD-10-CM

## 2023-12-08 DIAGNOSIS — Z122 Encounter for screening for malignant neoplasm of respiratory organs: Secondary | ICD-10-CM

## 2023-12-08 DIAGNOSIS — Z87891 Personal history of nicotine dependence: Secondary | ICD-10-CM

## 2023-12-09 ENCOUNTER — Ambulatory Visit
Admission: RE | Admit: 2023-12-09 | Discharge: 2023-12-09 | Disposition: A | Discharge status: Home / Self Care | Payer: Medicare HMO | Source: Ambulatory Visit | Attending: Internal Medicine | Admitting: Internal Medicine

## 2023-12-09 DIAGNOSIS — Z1231 Encounter for screening mammogram for malignant neoplasm of breast: Secondary | ICD-10-CM | POA: Diagnosis present

## 2024-03-23 ENCOUNTER — Other Ambulatory Visit: Payer: Self-pay | Admitting: Internal Medicine

## 2024-06-21 ENCOUNTER — Telehealth: Payer: Self-pay

## 2024-06-21 ENCOUNTER — Telehealth: Payer: Self-pay | Admitting: Internal Medicine

## 2024-06-21 NOTE — Telephone Encounter (Unsigned)
 Copied from CRM [id number]. Topic: Clinical - Medication Refill >> Jun 21, 2024  2:19 PM Takeyla J wrote: Medication:  bisoprolol -hydrochlorothiazide  (ZIAC ) 10-6.25 MG tablet amLODipine  (NORVASC ) 10 MG tablet rOPINIRole  (REQUIP ) 0.5 MG tablet losartan  (COZAAR ) 100 MG tablet montelukast  (SINGULAIR ) 10 MG tablet    Has the patient contacted their pharmacy? Yes (Agent: If no, request that the patient contact the pharmacy for the refill. If patient does not wish to contact the pharmacy document the reason why and proceed with request.) (Agent: If yes, when and what did the pharmacy advise?)  This is the patient's preferred pharmacy:  [facility] - [place], [place] - [address] [facility] AT Portal to Registered [facility] [address] [place] [address] Phone: [phone] Fax: [phone]   Is this the correct pharmacy for this prescription? Yes If no, delete pharmacy and type the correct one.   Has the prescription been filled recently? Yes  Is the patient out of the medication? No  Has the patient been seen for an appointment in the last year OR does the patient have an upcoming appointment? Yes  Can we respond through MyChart? Yes  Agent: Please be advised that Rx refills may take up to 3 business days. We ask that you follow-up with your pharmacy.

## 2024-06-21 NOTE — Telephone Encounter (Signed)
 Copied from CRM [id number]. Topic: General - Other >> Jun 20, 2024  4:04 PM Adrionna Y wrote: Reason for CRM: Reason for CRM: Patient is calling because her mailservice for her prescriptions sent over a request for more refills on her medication. She wanting that put in so that when Dr Jimmy lucky she is not struggling to get another doctor to refill her medication    She is also calling because she wants to see Dr Jimmy once more before he leaves and she wants to make sure that her visit on 9/8 will be covered by [place] medicare and she will not have to pay anything   She can be reached by her mobile number on file

## 2024-06-21 NOTE — Telephone Encounter (Signed)
 Left message on VM for pt to let me know if she needs any refills. And, advised her to check with her insurance about seeing him sooner than 1 year for a medicare wellness. He is retiring at the end of August now.

## 2024-06-21 NOTE — Telephone Encounter (Unsigned)
 Copied from CRM [id number]. Topic: Appointments - Scheduling Inquiry for Clinic >> Jun 21, 2024  2:55 PM Donna BRAVO wrote: Reason for CRM: Patient provider Dr Jimmy is leaving the practice. Patient is asking if Dr Rilla would accept her as a new patient, patient is an established patient in the practice.   Patient would like an appt for physical in December. Please call patient with information. [phone]

## 2024-06-22 NOTE — Telephone Encounter (Signed)
 Ok to schedule CPE with me for December

## 2024-06-27 NOTE — Telephone Encounter (Signed)
 Patient has appt on 06/29/24; refills will be addressed at appt.

## 2024-06-29 ENCOUNTER — Encounter: Payer: Self-pay | Admitting: Internal Medicine

## 2024-06-29 ENCOUNTER — Ambulatory Visit: Admitting: Internal Medicine

## 2024-06-29 VITALS — BP 110/60 | HR 53 | Temp 98.0°F | Ht 62.5 in | Wt 152.0 lb

## 2024-06-29 DIAGNOSIS — F17210 Nicotine dependence, cigarettes, uncomplicated: Secondary | ICD-10-CM

## 2024-06-29 DIAGNOSIS — G2581 Restless legs syndrome: Secondary | ICD-10-CM | POA: Diagnosis not present

## 2024-06-29 DIAGNOSIS — J4520 Mild intermittent asthma, uncomplicated: Secondary | ICD-10-CM | POA: Diagnosis not present

## 2024-06-29 DIAGNOSIS — I10 Essential (primary) hypertension: Secondary | ICD-10-CM | POA: Diagnosis not present

## 2024-06-29 MED ORDER — ROPINIROLE HCL 0.5 MG PO TABS: 0.5000 mg | ORAL_TABLET | Freq: Every day | ORAL | 3 refills | Status: AC

## 2024-06-29 MED ORDER — OMEPRAZOLE 20 MG PO CPDR: 20.0000 mg | DELAYED_RELEASE_CAPSULE | Freq: Every day | ORAL | 3 refills | Status: AC

## 2024-06-29 MED ORDER — AMLODIPINE BESYLATE 10 MG PO TABS: 10.0000 mg | ORAL_TABLET | Freq: Every day | ORAL | 3 refills | Status: AC

## 2024-06-29 MED ORDER — BISOPROLOL-HYDROCHLOROTHIAZIDE 10-6.25 MG PO TABS: 1.0000 | ORAL_TABLET | Freq: Every day | ORAL | 3 refills | Status: AC

## 2024-06-29 MED ORDER — LEVALBUTEROL TARTRATE 45 MCG/ACT IN AERO: 1.0000 | INHALATION_SPRAY | Freq: Four times a day (QID) | RESPIRATORY_TRACT | 1 refills | Status: AC | PRN

## 2024-06-29 MED ORDER — LOSARTAN POTASSIUM 100 MG PO TABS: 100.0000 mg | ORAL_TABLET | Freq: Every day | ORAL | 3 refills | Status: AC

## 2024-06-29 NOTE — Assessment & Plan Note (Signed)
 BP Readings from Last 3 Encounters:  06/29/24 110/60  10/11/23 100/70  10/08/22 124/80   Controlled on bisoprolol /hydrochlorothiazide  10/6.25 and losartan  100 daily

## 2024-06-29 NOTE — Assessment & Plan Note (Signed)
 Is still on the singulair  Discussed my concerns that the cough could be from her smoking (emphysema on CT scan) Will try omeprazole  nightly for a month or so to see if that helps

## 2024-06-29 NOTE — Progress Notes (Signed)
 Subjective:    Patient ID: Brandy Hamilton, female    DOB: 03-Jul-1956, 68 y.o.   MRN: [id number]  HPI Here for follow up of multiple medical issues  Does have a chronic cough Gets some wheezing as well Wondered about the montelukast  Got this from Dr Fernand many years ago Not really SOB Clear sputum at times--but not a lot Rarely uses the inhaler--needs new one. Not sure if it helps  Tried omeprazole  periodically---after dentist mentioned reflux Not regularly  Still uses ropinirole  at bedtime Still doesn't sleep great but this does help the restless legs  Current Outpatient Medications on File Prior to Visit  Medication Sig Dispense Refill   amLODipine  (NORVASC ) 10 MG tablet Take 1 tablet (10 mg total) by mouth daily. 90 tablet 3   benzonatate  (TESSALON ) 200 MG capsule Take 1 capsule (200 mg total) by mouth 3 (three) times daily as needed for cough. 60 capsule 1   bisoprolol -hydrochlorothiazide  (ZIAC ) 10-6.25 MG tablet Take 1 tablet by mouth daily. 90 tablet 3   Calcium Carbonate (CALCIUM 500 PO) Take by mouth daily.     cetirizine  (ZYRTEC ) 10 MG tablet TAKE 1 TABLET BY MOUTH EVERY DAY 90 tablet 3   hyoscyamine  (LEVSIN ) 0.125 MG tablet Take 1 tablet (0.125 mg total) by mouth 3 (three) times daily as needed. Before meals 30 tablet 1   levalbuterol  (XOPENEX  HFA) 45 MCG/ACT inhaler Inhale 1-2 puffs into the lungs every 6 (six) hours as needed. 1 each 1   losartan  (COZAAR ) 100 MG tablet Take 1 tablet (100 mg total) by mouth daily. 90 tablet 3   montelukast  (SINGULAIR ) 10 MG tablet TAKE 1 TABLET AT BEDTIME 90 tablet 3   Omega-3 Fatty Acids (FISH OIL PO) Take by mouth.     Potassium 75 MG TABS Take by mouth daily.     rOPINIRole  (REQUIP ) 0.5 MG tablet Take 1 tablet (0.5 mg total) by mouth at bedtime. 90 tablet 3   No current facility-administered medications on file prior to visit.    Allergies  Allergen Reactions   Beclomethasone Dipropionate Other (See Comments)    Patient unsure  of reaction.   Levofloxacin Other (See Comments)    Patient unsure of reaction.    Ramipril     REACTION: Cough    Past Medical History:  Diagnosis Date   Allergy    pollen, seasonal, perfumes, medications   Asthma    Breast cancer (HCC) 2008   radiation   Cancer (HCC) 11/07/2007   left partial mastectomy done 12/12/2007-DCIS ER/PR neg,positive margins identified to re-excisione done    DCIS (ductal carcinoma in situ) of breast    partial mastectomy and RT-----------------Dr Byrnett   Dry eye    Hyperlipidemia    Hypertension    1998   Personal history of tobacco use, presenting hazards to health    Special screening for malignant neoplasms, colon     Past Surgical History:  Procedure Laterality Date   BELPHAROPTOSIS REPAIR Bilateral 01/2017   BREAST BIOPSY Left    1998    Breast biopsy negative - Byrnett, 3/00     Left breast biopsy- stereotactic10/05   Left breast biopsy - (-) Byrnett11/08   Left breast biopsy--L partial mastectomy for DCIS, ,    BREAST EXCISIONAL BIOPSY Left 2008   positive   BREAST SURGERY Left 11/07/2007   3 cm area of DCIS, resected the negative margins. PR: 0%; ER less than 5%. Treated by whole breast radiation, patient declined antiestrogen therapy.  COLONOSCOPY  2009   hyperplastic polyp from rectum, Dr. dessa   COLONOSCOPY WITH PROPOFOL  N/A 08/31/2018   Procedure: COLONOSCOPY WITH PROPOFOL ;  Surgeon: dessa Reyes ORN, MD;  Location: [facility] ENDOSCOPY;  Service: Endoscopy;  Laterality: N/A;   COLONOSCOPY WITH PROPOFOL  N/A 10/29/2021   Procedure: COLONOSCOPY WITH PROPOFOL ;  Surgeon: dessa Reyes ORN, MD;  Location: [facility] ENDOSCOPY;  Service: Endoscopy;  Laterality: N/A;    Family History  Problem Relation Age of Onset   Cancer Maternal Grandfather    Cancer Other        breast cancer   Heart disease Mother    Hypertension Mother    Breast cancer Neg Hx     Social History   Socioeconomic History   Marital status: Divorced    Spouse  name: Not on file   Number of children: 0   Years of education: Not on file   Highest education level: Not on file  Occupational History   Occupation: Training and development officer - [employer]    Comment: retired   Occupation: Recruitment consultant, taxes---part time    Comment:     Occupation: Rental properties   Occupation:    Tobacco Use   Smoking status: Every Day    Current packs/day: 0.50    Average packs/day: 0.5 packs/day for 47.5 years (23.8 ttl pk-yrs)    Types: Cigarettes    Start date: 1978    Passive exposure: Never   Smokeless tobacco: Never   Tobacco comments:    discussed cold [place] stopping since not an everyday smoker  Vaping Use   Vaping status: Never Used  Substance and Sexual Activity   Alcohol use: Yes    Comment: drinks rarely   Drug use: Never   Sexual activity: Not on file  Other Topics Concern   Not on file  Social History Narrative   Has living will   Requests friend Bruna Fortes as health care POA   Would accept resuscitation attempts   No tube feeds   Social Drivers of Corporate investment banker Strain: Not on file  Food Insecurity: Not on file  Transportation Needs: Not on file  Physical Activity: Not on file  Stress: Not on file  Social Connections: Not on file  Intimate Partner Violence: Not on file   Review of Systems Getting treatment for dry eye---in preparation for eventual cataract removal       Objective:   Physical Exam Constitutional:      Appearance: Normal appearance.  Cardiovascular:     Rate and Rhythm: Normal rate and regular rhythm.     Heart sounds: No murmur heard.    No gallop.  Pulmonary:     Effort: Pulmonary effort is normal.     Breath sounds: Normal breath sounds. No wheezing or rales.  Abdominal:     Palpations: Abdomen is soft.     Tenderness: There is no abdominal tenderness.  Musculoskeletal:     Cervical back: Neck supple.     Right lower leg: No edema.     Left lower leg: No edema.  Lymphadenopathy:      Cervical: No cervical adenopathy.  Neurological:     Mental Status: She is alert.            Assessment & Plan:

## 2024-06-29 NOTE — Assessment & Plan Note (Signed)
Urged her to stop smoking

## 2024-06-29 NOTE — Assessment & Plan Note (Signed)
 Some sleep issues but the RLS seems controlled with the ropinirole 

## 2024-07-12 ENCOUNTER — Encounter: Payer: Self-pay | Admitting: Acute Care

## 2024-08-28 ENCOUNTER — Encounter: Payer: Medicare HMO | Admitting: Internal Medicine

## 2024-10-01 ENCOUNTER — Other Ambulatory Visit: Payer: Self-pay | Admitting: Family Medicine

## 2024-10-01 DIAGNOSIS — G2581 Restless legs syndrome: Secondary | ICD-10-CM

## 2024-10-01 DIAGNOSIS — E785 Hyperlipidemia, unspecified: Secondary | ICD-10-CM

## 2024-10-04 ENCOUNTER — Other Ambulatory Visit (INDEPENDENT_AMBULATORY_CARE_PROVIDER_SITE_OTHER)

## 2024-10-04 DIAGNOSIS — G2581 Restless legs syndrome: Secondary | ICD-10-CM

## 2024-10-04 DIAGNOSIS — E785 Hyperlipidemia, unspecified: Secondary | ICD-10-CM | POA: Diagnosis not present

## 2024-10-04 LAB — COMPREHENSIVE METABOLIC PANEL WITH GFR
ALT: 13 U/L (ref 0–35)
AST: 16 U/L (ref 0–37)
Albumin: 4.2 g/dL (ref 3.5–5.2)
Alkaline Phosphatase: 80 U/L (ref 39–117)
BUN: 19 mg/dL (ref 6–23)
CO2: 27 meq/L (ref 19–32)
Calcium: 9.8 mg/dL (ref 8.4–10.5)
Chloride: 105 meq/L (ref 96–112)
Creatinine, Ser: 0.83 mg/dL (ref 0.40–1.20)
GFR: 73.54 mL/min (ref >60.00)
Glucose, Bld: 129 mg/dL — ABNORMAL HIGH (ref 70–99)
Potassium: 3.9 meq/L (ref 3.5–5.1)
Sodium: 142 meq/L (ref 135–145)
Total Bilirubin: 0.7 mg/dL (ref 0.2–1.2)
Total Protein: 6.7 g/dL (ref 6.0–8.3)

## 2024-10-04 LAB — CBC WITH DIFFERENTIAL/PLATELET
Basophils Absolute: 0.1 K/uL (ref 0.0–0.1)
Basophils Relative: 0.8 % (ref 0.0–3.0)
Eosinophils Absolute: 0.4 K/uL (ref 0.0–0.7)
Eosinophils Relative: 4.9 % (ref 0.0–5.0)
HCT: 41.7 % (ref 36.0–46.0)
Hemoglobin: 14.2 g/dL (ref 12.0–15.0)
Lymphocytes Relative: 21.6 % (ref 12.0–46.0)
Lymphs Abs: 1.9 K/uL (ref 0.7–4.0)
MCHC: 34 g/dL (ref 30.0–36.0)
MCV: 96.2 fl (ref 78.0–100.0)
Monocytes Absolute: 0.5 K/uL (ref 0.1–1.0)
Monocytes Relative: 5.9 % (ref 3.0–12.0)
Neutro Abs: 6 K/uL (ref 1.4–7.7)
Neutrophils Relative %: 66.8 % (ref 43.0–77.0)
Platelets: 252 K/uL (ref 150.0–400.0)
RBC: 4.33 Mil/uL (ref 3.87–5.11)
RDW: 12.7 % (ref 11.5–15.5)
WBC: 8.9 K/uL (ref 4.0–10.5)

## 2024-10-04 LAB — IBC PANEL
Iron: 157 ug/dL — ABNORMAL HIGH (ref 42–145)
Saturation Ratios: 53.9 % — ABNORMAL HIGH (ref 20.0–50.0)
TIBC: 291.2 ug/dL (ref 250.0–450.0)
Transferrin: 208 mg/dL — ABNORMAL LOW (ref 212.0–360.0)

## 2024-10-04 LAB — LIPID PANEL
Cholesterol: 243 mg/dL — ABNORMAL HIGH (ref 0–200)
HDL: 40.4 mg/dL (ref >39.00)
LDL Cholesterol: 127 mg/dL — ABNORMAL HIGH (ref 0–99)
NonHDL: 202.17
Total CHOL/HDL Ratio: 6
Triglycerides: 376 mg/dL — ABNORMAL HIGH (ref 0.0–149.0)
VLDL: 75.2 mg/dL — ABNORMAL HIGH (ref 0.0–40.0)

## 2024-10-04 LAB — FERRITIN: Ferritin: 281.6 ng/mL (ref 10.0–291.0)

## 2024-10-08 ENCOUNTER — Ambulatory Visit: Payer: Self-pay | Admitting: Family Medicine

## 2024-10-11 ENCOUNTER — Encounter: Payer: Self-pay | Admitting: Family Medicine

## 2024-10-11 ENCOUNTER — Ambulatory Visit (INDEPENDENT_AMBULATORY_CARE_PROVIDER_SITE_OTHER): Admitting: Family Medicine

## 2024-10-11 VITALS — BP 112/70 | HR 53 | Temp 98.4°F | Ht 61.5 in | Wt 153.2 lb

## 2024-10-11 DIAGNOSIS — G2581 Restless legs syndrome: Secondary | ICD-10-CM

## 2024-10-11 DIAGNOSIS — R001 Bradycardia, unspecified: Secondary | ICD-10-CM | POA: Diagnosis not present

## 2024-10-11 DIAGNOSIS — J432 Centrilobular emphysema: Secondary | ICD-10-CM

## 2024-10-11 DIAGNOSIS — R739 Hyperglycemia, unspecified: Secondary | ICD-10-CM

## 2024-10-11 DIAGNOSIS — I2510 Atherosclerotic heart disease of native coronary artery without angina pectoris: Secondary | ICD-10-CM

## 2024-10-11 DIAGNOSIS — F17210 Nicotine dependence, cigarettes, uncomplicated: Secondary | ICD-10-CM

## 2024-10-11 DIAGNOSIS — Z0000 Encounter for general adult medical examination without abnormal findings: Secondary | ICD-10-CM | POA: Diagnosis not present

## 2024-10-11 DIAGNOSIS — E2839 Other primary ovarian failure: Secondary | ICD-10-CM

## 2024-10-11 DIAGNOSIS — E785 Hyperlipidemia, unspecified: Secondary | ICD-10-CM

## 2024-10-11 DIAGNOSIS — I10 Essential (primary) hypertension: Secondary | ICD-10-CM

## 2024-10-11 DIAGNOSIS — Z8249 Family history of ischemic heart disease and other diseases of the circulatory system: Secondary | ICD-10-CM

## 2024-10-11 DIAGNOSIS — Z853 Personal history of malignant neoplasm of breast: Secondary | ICD-10-CM

## 2024-10-11 DIAGNOSIS — J309 Allergic rhinitis, unspecified: Secondary | ICD-10-CM

## 2024-10-11 DIAGNOSIS — Z0001 Encounter for general adult medical examination with abnormal findings: Secondary | ICD-10-CM

## 2024-10-11 DIAGNOSIS — K580 Irritable bowel syndrome with diarrhea: Secondary | ICD-10-CM

## 2024-10-11 DIAGNOSIS — R21 Rash and other nonspecific skin eruption: Secondary | ICD-10-CM | POA: Insufficient documentation

## 2024-10-11 DIAGNOSIS — Z7189 Other specified counseling: Secondary | ICD-10-CM | POA: Insufficient documentation

## 2024-10-11 DIAGNOSIS — J449 Chronic obstructive pulmonary disease, unspecified: Secondary | ICD-10-CM | POA: Insufficient documentation

## 2024-10-11 LAB — POCT GLYCOSYLATED HEMOGLOBIN (HGB A1C): Hemoglobin A1C: 5.6 % (ref 4.0–5.6)

## 2024-10-11 MED ORDER — CALCIUM-VITAMIN D-MINERALS 600-400 MG-UNIT PO CHEW: 1.0000 | CHEWABLE_TABLET | Freq: Every day | ORAL | Status: AC

## 2024-10-11 MED ORDER — VITAMIN D3 25 MCG (1000 UT) PO CAPS: 1.0000 | ORAL_CAPSULE | Freq: Every day | ORAL | Status: AC

## 2024-10-11 NOTE — Assessment & Plan Note (Addendum)
 Noted based on mildly elevated iron and % saturation to 54%, ferritin levels high normal for female.  LFTs normal, no personal h/o liver disease or known fmhx hereditary hemochromatosis.  She notes h/o erythrocytosis, however is a smoker, and reviewing chart, recent H/H have been normal.  Will rpt iron levels at f/u visit as well as check for Brandy Hamilton DNA evaluation.

## 2024-10-11 NOTE — Assessment & Plan Note (Signed)
Continue yearly mammogram.

## 2024-10-11 NOTE — Addendum Note (Signed)
 Addended by: RILLA BALLER on: 10/11/2024 05:02 PM   Modules accepted: Level of Service

## 2024-10-11 NOTE — Assessment & Plan Note (Signed)
 Suspect tinea fungal infection - rec OTC clotrimazole, ok to continue aquaphor. Not consistent with candidal rash

## 2024-10-11 NOTE — Progress Notes (Signed)
 Ph: [phone] Fax: [phone]   Patient ID: Brandy Hamilton, female    DOB: 05/23/1956, 68 y.o.   MRN: [id number]  This visit was conducted in person.  BP 112/70   Pulse (!) 53   Temp 98.4 F (36.9 C) (Oral)   Ht 5' 1.5 (1.562 m)   Wt 153 lb 4 oz (69.5 kg)   SpO2 97%   BMI 28.49 kg/m   BP Readings from Last 3 Encounters:  10/11/24 112/70  06/29/24 110/60  10/11/23 100/70   Pulse Readings from Last 3 Encounters:  10/11/24 (!) 53  06/29/24 (!) 53  10/11/23 (!) 52   CC: Brandy Hamilton -transfer of care  Subjective:   HPI: Brandy Hamilton is a 68 y.o. female presenting on 10/11/2024 for Annual Exam   Did not see health advisor this year.  No results found.  Flowsheet Row Office Visit from 10/11/2024 in [facility] [place]  PHQ-2 Total Score 0       10/11/2024    2:37 PM 06/29/2024    3:55 PM 10/11/2023    3:50 PM 10/11/2023    3:20 PM  Fall Risk   Falls in the past year? 0 0 0 0  Number falls in past yr: 0 0  0  Injury with Fall? 0 0  0  Risk for fall due to : No Fall Risks No Fall Risks  No Fall Risks  Follow up Falls evaluation completed Falls evaluation completed  Falls evaluation completed  Previously saw Dr Jimmy, last seen 06/2024  HTN - manages with amlodipine  10mg  daily, Ziac  (bisoprolol  hydrochlorothiazide ) 10/6.25mg  daily and losartan  100mg  daily.  RLS on ropinirole  at bedtime.  Chronic cough - thought smoking related - most recently prior PCP placed her on omeprazole  20mg  daily in case GERD component.   IBS on PRN hyoscyamine   On fish oil for dry eyes  She regularly takes SAMe and Serovital aging supplement.  Also taking Slenderize drops  She doesn't take iron regularly but may have iron in MVI.  Suggested she stop above supplements.   New itchy rash under left breast that developed ~1 week ago.   H/o L breast cancer s/p radiation 2008   Other doctors---Dr Byrnett/Sakai--general surgeon, Dr Rosalynd,  Dr Meryl   Fmhx CAD - mother age 40s  Preventative: Colonoscopy 10/2021 - TAx2, SSP, HP, rpt 3 yrs Cira) Mammogram 11/2023 - Birads1 @ [facility]. H/o breast cancer Well woman exam - last pap 2024 with PCP [place] - normal pap, absent transformation zone Lung cancer screening - receiving yearly screening, last 11/2023 DEXA scan - will order. Discussed good calcium intake as well as separate vit D 1000 units daily.  Flu shot - yearly COVID shot x1  Td 2007, Tdap 2017 Pneumovax 12/2021, prevnar-13 2020 RSV - discussed - to get through pharmacy Shingrix - discussed - to get through pharmacy Advanced directive discussion - does have this at home. Asked to bring us  a copy. 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  Seat belt use discussed Sunscreen use discussed. No changing moles on skin. Current smoker - 1/2 ppd  Alcohol - seldom Dentist - q6 mo Eye exam - yearly Bowel - occ bowel urgency due to IBS - easy constipation if med taken Bladder -  no incontinence   Lives alone  No family nearby     Relevant past medical, surgical, family and social history reviewed and updated as indicated. Interim medical history since  our last visit reviewed. Allergies and medications reviewed and updated. Outpatient Medications Prior to Visit  Medication Sig Dispense Refill   amLODipine  (NORVASC ) 10 MG tablet Take 1 tablet (10 mg total) by mouth daily. 90 tablet 3   benzonatate  (TESSALON ) 200 MG capsule Take 1 capsule (200 mg total) by mouth 3 (three) times daily as needed for cough. 60 capsule 1   bisoprolol -hydrochlorothiazide  (ZIAC ) 10-6.25 MG tablet Take 1 tablet by mouth daily. 90 tablet 3   cetirizine  (ZYRTEC ) 10 MG tablet TAKE 1 TABLET BY MOUTH EVERY DAY 90 tablet 3   hyoscyamine  (LEVSIN ) 0.125 MG tablet Take 1 tablet (0.125 mg total) by mouth 3 (three) times daily as needed. Before meals 30 tablet 1   levalbuterol  (XOPENEX  HFA) 45 MCG/ACT inhaler Inhale 1-2 puffs into the  lungs every 6 (six) hours as needed. 1 each 1   losartan  (COZAAR ) 100 MG tablet Take 1 tablet (100 mg total) by mouth daily. 90 tablet 3   montelukast  (SINGULAIR ) 10 MG tablet TAKE 1 TABLET AT BEDTIME 90 tablet 3   Omega-3 Fatty Acids (FISH OIL PO) Take by mouth.     omeprazole  (PRILOSEC) 20 MG capsule Take 1 capsule (20 mg total) by mouth at bedtime. 90 capsule 3   Potassium 75 MG TABS Take by mouth daily.     rOPINIRole  (REQUIP ) 0.5 MG tablet Take 1 tablet (0.5 mg total) by mouth at bedtime. 90 tablet 3   Calcium Carbonate (CALCIUM 500 PO) Take by mouth daily.     No facility-administered medications prior to visit.     Per HPI unless specifically indicated in ROS section below Review of Systems  Constitutional:  Negative for activity change, appetite change, chills, fatigue, fever and unexpected weight change.  HENT:  Negative for hearing loss.   Eyes:  Negative for visual disturbance.  Respiratory:  Positive for cough and wheezing. Negative for chest tightness and shortness of breath.   Cardiovascular:  Negative for chest pain, palpitations and leg swelling.  Gastrointestinal:  Positive for blood in stool (BRBPR - thought hemorrhoids\). Negative for abdominal distention, abdominal pain, constipation, diarrhea, nausea and vomiting.  Genitourinary:  Negative for difficulty urinating and hematuria.  Musculoskeletal:  Negative for arthralgias, myalgias and neck pain.  Skin:  Positive for rash.  Neurological:  Negative for dizziness, seizures, syncope and headaches.  Hematological:  Negative for adenopathy. Does not bruise/bleed easily.  Psychiatric/Behavioral:  Negative for dysphoric mood. The patient is not nervous/anxious.     Objective:  BP 112/70   Pulse (!) 53   Temp 98.4 F (36.9 C) (Oral)   Ht 5' 1.5 (1.562 m)   Wt 153 lb 4 oz (69.5 kg)   SpO2 97%   BMI 28.49 kg/m   Wt Readings from Last 3 Encounters:  10/11/24 153 lb 4 oz (69.5 kg)  06/29/24 152 lb (68.9 kg)   10/11/23 150 lb (68 kg)      Physical Exam Vitals and nursing note reviewed.  Constitutional:      Appearance: Normal appearance. She is not ill-appearing.  HENT:     Head: Normocephalic and atraumatic.     Right Ear: Tympanic membrane, ear canal and external ear normal. There is no impacted cerumen.     Left Ear: Tympanic membrane, ear canal and external ear normal. There is no impacted cerumen.     Mouth/Throat:     Mouth: Mucous membranes are moist.     Pharynx: Oropharynx is clear. No oropharyngeal exudate or posterior oropharyngeal  erythema.  Eyes:     General:        Right eye: No discharge.        Left eye: No discharge.     Extraocular Movements: Extraocular movements intact.     Conjunctiva/sclera: Conjunctivae normal.     Pupils: Pupils are equal, round, and reactive to light.  Neck:     Thyroid: No thyroid mass or thyromegaly.     Vascular: No carotid bruit.  Cardiovascular:     Rate and Rhythm: Normal rate and regular rhythm.     Pulses: Normal pulses.     Heart sounds: Normal heart sounds. No murmur heard. Pulmonary:     Effort: Pulmonary effort is normal. No respiratory distress.     Breath sounds: Normal breath sounds. No wheezing, rhonchi or rales.  Chest:       Comments: Pruritic, mildly erythematous hyperkeratotic patch below left breast with few papules, without satellite lesions  Abdominal:     General: Bowel sounds are normal. There is no distension.     Palpations: Abdomen is soft. There is no mass.     Tenderness: There is no abdominal tenderness. There is no guarding or rebound.     Hernia: No hernia is present.  Musculoskeletal:     Cervical back: Normal range of motion and neck supple. No rigidity.     Right lower leg: No edema.     Left lower leg: No edema.  Lymphadenopathy:     Cervical: No cervical adenopathy.  Skin:    General: Skin is warm and dry.     Findings: Rash present.  Neurological:     General: No focal deficit present.      Mental Status: She is alert. Mental status is at baseline.     Comments: Recall 3/3  Calculation 3/5 DRLOW  Psychiatric:        Mood and Affect: Mood normal.        Behavior: Behavior normal.       Results for orders placed or performed in visit on 10/11/24  POCT glycosylated hemoglobin (Hb A1C)   Collection Time: 10/11/24  4:37 PM  Result Value Ref Range   Hemoglobin A1C 5.6 4.0 - 5.6 %   HbA1c POC (<> result, manual entry)     HbA1c, POC (prediabetic range)     HbA1c, POC (controlled diabetic range)     Lab Results  Component Value Date   CHOL 243 (H) 10/04/2024   HDL 40.40 10/04/2024   LDLCALC 127 (H) 10/04/2024   LDLDIRECT 124.0 09/19/2020   TRIG 376.0 (H) 10/04/2024   CHOLHDL 6 10/04/2024    Lab Results  Component Value Date   NA 142 10/04/2024   CL 105 10/04/2024   K 3.9 10/04/2024   CO2 27 10/04/2024   BUN 19 10/04/2024   CREATININE 0.83 10/04/2024   GFR 73.54 10/04/2024   CALCIUM 9.8 10/04/2024   PHOS 3.8 05/04/2008   ALBUMIN 4.2 10/04/2024   GLUCOSE 129 (H) 10/04/2024    Lab Results  Component Value Date   ALT 13 10/04/2024   AST 16 10/04/2024   ALKPHOS 80 10/04/2024   BILITOT 0.7 10/04/2024    Lab Results  Component Value Date   WBC 8.9 10/04/2024   HGB 14.2 10/04/2024   HCT 41.7 10/04/2024   MCV 96.2 10/04/2024   PLT 252.0 10/04/2024    Lab Results  Component Value Date   TSH 0.99 07/13/2013    Lab Results  Component Value  Date   IRON 157 (H) 10/04/2024   TIBC 291.2 10/04/2024   FERRITIN 281.6 10/04/2024   Assessment & Plan:   Problem List Items Addressed This Visit     Encounter for general adult medical examination with abnormal findings (Chronic)   Preventative protocols reviewed and updated unless pt declined. Discussed healthy diet and lifestyle.       Advanced directives, counseling/discussion (Chronic)   Advanced directive discussion - does have this at home. Asked to bring us  a copy. 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       Medicare annual wellness visit, initial - Primary (Chronic)   I have personally reviewed the Medicare Annual Wellness questionnaire and have noted 1. The patient's medical and social history 2. Their use of alcohol, tobacco or illicit drugs 3. Their current medications and supplements 4. The patient's functional ability including ADL's, fall risks, home safety risks and hearing or visual impairment. Cognitive function has been assessed and addressed as indicated.  5. Diet and physical activity 6. Evidence for depression or mood disorders The patients weight, height, BMI have been recorded in the chart. I have made referrals, counseling and provided education to the patient based on review of the above and I have provided the pt with a written personalized care plan for preventive services. Provider list updated.. See scanned questionairre as needed for further documentation. Reviewed preventative protocols and updated unless pt declined.       Dyslipidemia   Reviewed increasing cholesterol levels - concerning in setting of current smoker and fmhx CAD (mother age 41s). Did recommend starting statin - she is hesitant. Discussed cardiac CT with calcium score - she will consider.  Reviewed diet choices to improve cholesterol control. Recommend mediterranean  diet.  She will focus on diet changes and recheck FLP in 3 months with further treatment recommendations pending results.  In fmhx CAD (mother) will check Lp(a) next labs.  The 10-year ASCVD risk score (Arnett DK, et al., 2019) is: 13.8%   Values used to calculate the score:     Age: 78 years     Clincally relevant sex: Female     Is Non-Hispanic African American: No     Diabetic: No     Tobacco smoker: Yes     Systolic Blood Pressure: 112 mmHg     Is BP treated: Yes     HDL Cholesterol: 40.4 mg/dL     Total Cholesterol: 243 mg/dL d      Relevant Orders   Lipid panel   Lipoprotein A (LPA)   RESTLESS LEG SYNDROME    This has been chronically managed with ropinirole  - she has been taking this in am - rec change to night time.  Iron levels checked for h/o RLS r/o iron deficiency contribution - levels actually elevated - see below.       Essential hypertension, benign   Continue current 4 drug regimen which is effective.       Bradycardia   Chronic issue, she is on bisoprolol       Allergic rhinitis   Managed with singulair  and zyrtec       History of breast cancer   Continue yearly mammogram      IBS (irritable bowel syndrome)   Continue PRN hyoscyamine       Cigarette smoker   Reviewed risks of ongoing smoking including worsening lung disease, heart disease, increased risk of heart attack and stroke. Encouraged full cessation.  She is already undergoing lung cancer  screening program.       Hyperglycemia   POC A1c today normal 5.6      Relevant Orders   POCT glycosylated hemoglobin (Hb A1C) (Completed)   Iron excess   Noted based on mildly elevated iron and % saturation to 54%, ferritin levels high normal for female.  LFTs normal, no personal h/o liver disease or known fmhx hereditary hemochromatosis.  She notes h/o erythrocytosis, however is a smoker, and reviewing chart, recent H/H have been normal.  Will rpt iron levels at f/u visit as well as check for [facility] DNA evaluation.       Relevant Orders   Ferritin   IBC panel   Hemochromatosis DNA-PCR(c282y,h63d)   TSH   Skin rash   Suspect tinea fungal infection - rec OTC clotrimazole, ok to continue aquaphor. Not consistent with candidal rash       COPD (chronic obstructive pulmonary disease) (HCC)   Centrilobular and paraseptal emphysema noted on lung cancer screening CT - she is overall asxs from this standpoint. Encouraged full smoking cessation.       Coronary artery calcification seen on CAT scan   Noted on lung cancer screening.  Encouraged smoking cessation, rec start statin. See below.       Family history of  premature CAD   Update Lp(a) next labwork      Relevant Orders   Lipoprotein A (LPA)   Other Visit Diagnoses       Estrogen deficiency       Relevant Orders   DG Bone Density        Meds ordered this encounter  Medications   Calcium Carbonate-Vit D-Min (CALCIUM-VITAMIN D-MINERALS) 600-400 MG-UNIT CHEW    Sig: Chew 1 tablet by mouth daily.   Cholecalciferol (VITAMIN D3) 25 MCG (1000 UT) CAPS    Sig: Take 1 capsule (1,000 Units total) by mouth daily.    Orders Placed This Encounter  Procedures   DG Bone Density    Standing Status:   Future    Expiration Date:   10/11/2025    Reason for Exam (SYMPTOM  OR DIAGNOSIS REQUIRED):   osteoporosis screen    Preferred imaging location?:   [facility]   Lipid panel    Standing Status:   Future    Expiration Date:   10/11/2025   Lipoprotein A (LPA)    Standing Status:   Future    Expiration Date:   10/11/2025   Ferritin    Standing Status:   Future    Expiration Date:   10/11/2025   IBC panel    Standing Status:   Future    Expiration Date:   10/11/2025   Hemochromatosis DNA-PCR(c282y,h63d)    Standing Status:   Future    Expiration Date:   10/11/2025   TSH    Standing Status:   Future    Expiration Date:   10/11/2025   POCT glycosylated hemoglobin (Hb A1C)    Patient Instructions  Call your surgeon's office to ask about colonoscopy due (I believe it's due 10/2024).  Call to to schedule bone density scan: [facility] at [facility] [phone] If interested, check with pharmacy about 2 shot shingles series (shingrix) and RSV shot.  Check on living will at home, see if needs updating, bring us  copy of medical component when complete.  Try over the counter clotrimazole (lotrimin) antifungal cream to rash under left breast daily. Ok to continue aquaphor. Work on quitting smoking.  Fingerstick A1c today  Good  to see you today  Schedule lab visit only in 3 months to recheck iron and cholesterol levels.  Return  as needed or in 6 months for follow up visit   Follow up plan: Return in about 6 months (around 04/11/2025) for follow up visit.  Anton Blas, MD

## 2024-10-11 NOTE — Assessment & Plan Note (Signed)
 Continue PRN hyoscyamine 

## 2024-10-11 NOTE — Assessment & Plan Note (Signed)
 Centrilobular and paraseptal emphysema noted on lung cancer screening CT - she is overall asxs from this standpoint. Encouraged full smoking cessation.

## 2024-10-11 NOTE — Assessment & Plan Note (Signed)
 Managed with singulair  and zyrtec 

## 2024-10-11 NOTE — Assessment & Plan Note (Addendum)
 Reviewed increasing cholesterol levels - concerning in setting of current smoker and fmhx CAD (mother age 6s). Did recommend starting statin - she is hesitant. Discussed cardiac CT with calcium score - she will consider.  Reviewed diet choices to improve cholesterol control. Recommend mediterranean  diet.  She will focus on diet changes and recheck FLP in 3 months with further treatment recommendations pending results.  In fmhx CAD (mother) will check Lp(a) next labs.  The 10-year ASCVD risk score (Arnett DK, et al., 2019) is: 13.8%   Values used to calculate the score:     Age: 68 years     Clincally relevant sex: Female     Is Non-Hispanic African American: No     Diabetic: No     Tobacco smoker: Yes     Systolic Blood Pressure: 112 mmHg     Is BP treated: Yes     HDL Cholesterol: 40.4 mg/dL     Total Cholesterol: 243 mg/dL d

## 2024-10-11 NOTE — Assessment & Plan Note (Signed)
 Update Lp(a) next labwork

## 2024-10-11 NOTE — Assessment & Plan Note (Signed)

## 2024-10-11 NOTE — Assessment & Plan Note (Signed)
 Chronic issue, she is on bisoprolol 

## 2024-10-11 NOTE — Assessment & Plan Note (Signed)
 Noted on lung cancer screening.  Encouraged smoking cessation, rec start statin. See below.

## 2024-10-11 NOTE — Assessment & Plan Note (Signed)
 Continue current 4 drug regimen which is effective.

## 2024-10-11 NOTE — Assessment & Plan Note (Signed)
 Reviewed risks of ongoing smoking including worsening lung disease, heart disease, increased risk of heart attack and stroke. Encouraged full cessation.  She is already undergoing lung cancer screening program.

## 2024-10-11 NOTE — Assessment & Plan Note (Addendum)
 Advanced directive discussion - does have this at home. Asked to bring us  a copy. 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

## 2024-10-11 NOTE — Assessment & Plan Note (Addendum)
 This has been chronically managed with ropinirole  - she has been taking this in am - rec change to night time.  Iron levels checked for h/o RLS r/o iron deficiency contribution - levels actually elevated - see below.

## 2024-10-11 NOTE — Assessment & Plan Note (Signed)
 Preventative protocols reviewed and updated unless pt declined. Discussed healthy diet and lifestyle.

## 2024-10-11 NOTE — Assessment & Plan Note (Signed)
 POC A1c today normal 5.6

## 2024-10-11 NOTE — Patient Instructions (Addendum)
 Call your surgeon's office to ask about colonoscopy due (I believe it's due 10/2024).  Call to to schedule bone density scan: [facility] at [facility] [phone] If interested, check with pharmacy about 2 shot shingles series (shingrix) and RSV shot.  Check on living will at home, see if needs updating, bring us  copy of medical component when complete.  Try over the counter clotrimazole (lotrimin) antifungal cream to rash under left breast daily. Ok to continue aquaphor. Work on quitting smoking.  Fingerstick A1c today  Good to see you today  Schedule lab visit only in 3 months to recheck iron and cholesterol levels.  Return as needed or in 6 months for follow up visit

## 2024-10-16 ENCOUNTER — Other Ambulatory Visit: Payer: Self-pay | Admitting: Family Medicine

## 2024-10-16 DIAGNOSIS — Z1231 Encounter for screening mammogram for malignant neoplasm of breast: Secondary | ICD-10-CM

## 2024-10-24 NOTE — H&P (View-Only) (Signed)
 Subjective:   CC: History of colon polyps [Z86.0100]  HPI:  referred by Reyes Elsie Cota* for evaluation of above.   History of Present Illness  Last cscope in 2022, hx of serrated polyps, recommended repeat in 60yrs.   Past Medical History:  has a past medical history of Asthma (HHS-HCC), Breast cancer (CMS/HHS-HCC) (11/07/2007), Hyperlipidemia, and Hypertension.  Past Surgical History:  has a past surgical history that includes Colonoscopy (08/31/2018); belpharoptosis (Bilateral, 2018); Colonoscopy (2009); Breast excisional biopsy (Left, 11/07/2007); and Colonoscopy (10/29/2021).  Family History: family history includes Cancer in her maternal grandfather; Colon cancer (age of onset: 62) in her cousin; Heart disease in her mother; High blood pressure (Hypertension) in her mother.  Social History:  reports that she has been smoking cigarettes. She has a 1.3 pack-year smoking history. She has never used smokeless tobacco. She reports current alcohol use. She reports that she does not use drugs.  Current Medications: has a current medication list which includes the following prescription(s): amlodipine , bisoprolol -hydrochlorothiazide , cetirizine , cholecalciferol, hyoscyamine , levalbuterol , losartan , miscellaneous medical supply, montelukast , multivitamin with minerals, omeprazole , polyethylene glycol, and ropinirole .  Allergies:  Allergies  Allergen Reactions  . Beclomethasone Other (See Comments)  . Levofloxacin Other (See Comments)  . Ramipril Cough    ROS:  A 15 point review of systems was performed and pertinent positives and negatives noted in HPI   Objective:     BP (!) 144/87   Pulse 54   Ht 158.8 cm (5' 2.5)   Wt 69.4 kg (153 lb)   BMI 27.54 kg/m   Constitutional :  No distress, cooperative, alert  Lymphatics/Throat:  Supple with no lymphadenopathy  Respiratory:  Clear to auscultation bilaterally  Cardiovascular:  Regular rate and rhythm  Gastrointestinal:  Soft, non-tender, non-distended, no organomegaly.  Musculoskeletal: Steady gait and movement  Skin: Cool and moist  Psychiatric: Normal affect, non-agitated, not confused         LABS:  N/a   RADS: N/a  Assessment:      History of colon polyps [Z86.0100]  Plan:     1. History of colon polyps [Z86.0100] R/b/a discussed.  Risks include bleeding, perforation.  Benefits include diagnostic, curative procedure if needed.  Alternatives include continued observation.  Pt verbalized understanding. Will schedule   labs/images/medications/previous chart entries reviewed personally and relevant changes/updates noted above.

## 2024-11-08 ENCOUNTER — Ambulatory Visit

## 2024-11-08 ENCOUNTER — Encounter
Admission: RE | Disposition: A | Discharge status: Home / Self Care | Payer: Self-pay | Source: Home / Self Care | Attending: Surgery

## 2024-11-08 ENCOUNTER — Ambulatory Visit
Admission: RE | Admit: 2024-11-08 | Discharge: 2024-11-08 | Disposition: A | Discharge status: Home / Self Care | Attending: Surgery | Admitting: Surgery

## 2024-11-08 ENCOUNTER — Encounter: Payer: Self-pay | Admitting: Surgery

## 2024-11-08 DIAGNOSIS — Z800 Family history of malignant neoplasm of digestive organs: Secondary | ICD-10-CM | POA: Insufficient documentation

## 2024-11-08 DIAGNOSIS — K644 Residual hemorrhoidal skin tags: Secondary | ICD-10-CM | POA: Insufficient documentation

## 2024-11-08 DIAGNOSIS — Z1211 Encounter for screening for malignant neoplasm of colon: Secondary | ICD-10-CM | POA: Insufficient documentation

## 2024-11-08 DIAGNOSIS — D124 Benign neoplasm of descending colon: Secondary | ICD-10-CM | POA: Diagnosis not present

## 2024-11-08 DIAGNOSIS — J4489 Other specified chronic obstructive pulmonary disease: Secondary | ICD-10-CM | POA: Insufficient documentation

## 2024-11-08 DIAGNOSIS — I2510 Atherosclerotic heart disease of native coronary artery without angina pectoris: Secondary | ICD-10-CM | POA: Diagnosis not present

## 2024-11-08 DIAGNOSIS — D125 Benign neoplasm of sigmoid colon: Secondary | ICD-10-CM | POA: Insufficient documentation

## 2024-11-08 DIAGNOSIS — I10 Essential (primary) hypertension: Secondary | ICD-10-CM | POA: Insufficient documentation

## 2024-11-08 DIAGNOSIS — K5730 Diverticulosis of large intestine without perforation or abscess without bleeding: Secondary | ICD-10-CM | POA: Insufficient documentation

## 2024-11-08 DIAGNOSIS — Q438 Other specified congenital malformations of intestine: Secondary | ICD-10-CM | POA: Diagnosis not present

## 2024-11-08 DIAGNOSIS — K620 Anal polyp: Secondary | ICD-10-CM | POA: Diagnosis not present

## 2024-11-08 HISTORY — PX: COLONOSCOPY: SHX5424

## 2024-11-08 SURGERY — COLONOSCOPY
Anesthesia: General

## 2024-11-08 MED ORDER — GLYCOPYRROLATE 0.2 MG/ML IJ SOLN
INTRAMUSCULAR | Status: AC
  Filled 2024-11-08: qty 1

## 2024-11-08 MED ORDER — EPHEDRINE SULFATE (PRESSORS) 25 MG/5ML IV SOSY
PREFILLED_SYRINGE | INTRAVENOUS | Status: DC | PRN
  Administered 2024-11-08 (×4): 5 mg via INTRAVENOUS

## 2024-11-08 MED ORDER — PROPOFOL 1000 MG/100ML IV EMUL
INTRAVENOUS | Status: AC
  Filled 2024-11-08: qty 100

## 2024-11-08 MED ORDER — PROPOFOL 500 MG/50ML IV EMUL
INTRAVENOUS | Status: DC | PRN
Start: 2024-11-08 — End: 2024-11-08
  Administered 2024-11-08: 150 ug/kg/min via INTRAVENOUS

## 2024-11-08 MED ORDER — PHENYLEPHRINE HCL (PRESSORS) 10 MG/ML IV SOLN
INTRAVENOUS | Status: DC | PRN
  Administered 2024-11-08: 80 ug via INTRAVENOUS

## 2024-11-08 MED ORDER — LIDOCAINE HCL (PF) 2 % IJ SOLN
INTRAMUSCULAR | Status: AC
  Filled 2024-11-08: qty 5

## 2024-11-08 MED ORDER — DEXMEDETOMIDINE HCL IN NACL 200 MCG/50ML IV SOLN
INTRAVENOUS | Status: DC | PRN
  Administered 2024-11-08: 8 ug via INTRAVENOUS

## 2024-11-08 MED ORDER — GLYCOPYRROLATE 0.2 MG/ML IJ SOLN
INTRAMUSCULAR | Status: DC | PRN
  Administered 2024-11-08: .2 mg via INTRAVENOUS

## 2024-11-08 MED ORDER — PROPOFOL 10 MG/ML IV BOLUS
INTRAVENOUS | Status: AC
Start: 2024-11-08 — End: 2024-11-08
  Filled 2024-11-08: qty 20

## 2024-11-08 MED ORDER — LIDOCAINE HCL (CARDIAC) PF 100 MG/5ML IV SOSY
PREFILLED_SYRINGE | INTRAVENOUS | Status: DC | PRN
  Administered 2024-11-08: 100 mg via INTRAVENOUS

## 2024-11-08 MED ORDER — PROPOFOL 10 MG/ML IV BOLUS
INTRAVENOUS | Status: DC | PRN
  Administered 2024-11-08 (×2): 100 mg via INTRAVENOUS
  Administered 2024-11-08: 50 mg via INTRAVENOUS

## 2024-11-08 MED ORDER — DEXMEDETOMIDINE HCL IN NACL 80 MCG/20ML IV SOLN
INTRAVENOUS | Status: AC
  Filled 2024-11-08: qty 20

## 2024-11-08 MED ORDER — EPHEDRINE 5 MG/ML INJ
INTRAVENOUS | Status: AC
Start: 2024-11-08 — End: 2024-11-08
  Filled 2024-11-08: qty 5

## 2024-11-08 MED ORDER — PHENYLEPHRINE 80 MCG/ML (10ML) SYRINGE FOR IV PUSH (FOR BLOOD PRESSURE SUPPORT)
PREFILLED_SYRINGE | INTRAVENOUS | Status: AC
  Filled 2024-11-08: qty 10

## 2024-11-08 MED ORDER — SODIUM CHLORIDE 0.9 % IV SOLN
INTRAVENOUS | Status: DC
  Administered 2024-11-08: 20 mL/h via INTRAVENOUS

## 2024-11-08 NOTE — Interval H&P Note (Signed)
 History and Physical Interval Note:  11/08/2024 9:10 AM  Brandy Hamilton  has presented today for surgery, with the diagnosis of z86.0100 history of polyps.  The various methods of treatment have been discussed with the patient and family. After consideration of risks, benefits and other options for treatment, the patient has consented to  Procedure(s): COLONOSCOPY (N/A) as a surgical intervention.  The patient's history has been reviewed, patient examined, no change in status, stable for surgery.  I have reviewed the patient's chart and labs.  Questions were answered to the patient's satisfaction.     [name]

## 2024-11-08 NOTE — Anesthesia Preprocedure Evaluation (Signed)
 Anesthesia Evaluation  Patient identified by MRN, date of birth, ID band Patient awake    Reviewed: Allergy & Precautions, NPO status , Patient's Chart, lab work & pertinent test results  Airway Mallampati: III  TM Distance: <3 FB Neck ROM: full    Dental  (+) Chipped, Implants   Pulmonary shortness of breath and with exertion, asthma , COPD, Current Smoker   Pulmonary exam normal        Cardiovascular Exercise Tolerance: Good hypertension, (-) angina + CAD  Normal cardiovascular exam     Neuro/Psych negative neurological ROS  negative psych ROS   GI/Hepatic negative GI ROS, Neg liver ROS,neg GERD  ,,  Endo/Other  negative endocrine ROS    Renal/GU negative Renal ROS  negative genitourinary   Musculoskeletal   Abdominal   Peds  Hematology negative hematology ROS (+)   Anesthesia Other Findings Past Medical History: No date: Allergy     Comment:  pollen, seasonal, perfumes, medications No date: Asthma 2008: Breast cancer (HCC)     Comment:  radiation 11/07/2007: Cancer (HCC)     Comment:  left partial mastectomy done 12/12/2007-DCIS ER/PR               neg,positive margins identified to re-excisione done  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     Comment:  partial mastectomy and RT-----------------Dr Byrnett No date: Dry eye No date: Hyperlipidemia No date: Hypertension     Comment:  1998 No date: Personal history of tobacco use, presenting hazards to health No date: Special screening for malignant neoplasms, colon  Past Surgical History: 01/2017: BELPHAROPTOSIS REPAIR; Bilateral No date: BREAST BIOPSY; Left     Comment:  1998    Breast biopsy negative - Byrnett, 3/00     Left               breast biopsy- stereotactic10/05   Left breast biopsy -               (-) Byrnett11/08   Left breast biopsy--L partial               mastectomy for DCIS, ,  2008: BREAST EXCISIONAL BIOPSY; Left      Comment:  positive 11/07/2007: BREAST SURGERY; Left     Comment:  3 cm area of DCIS, resected the negative margins. PR:               0%; ER less than 5%. Treated by whole breast radiation,               patient declined antiestrogen therapy. 2009: COLONOSCOPY     Comment:  hyperplastic polyp from rectum, Dr. dessa 08/31/2018: COLONOSCOPY WITH PROPOFOL ; N/A     Comment:  Procedure: COLONOSCOPY WITH PROPOFOL ;  Surgeon: Dessa Reyes ORN, MD;  Location: [facility] ENDOSCOPY;  Service:               Endoscopy;  Laterality: N/A; 10/29/2021: COLONOSCOPY WITH PROPOFOL ; N/A     Comment:  Procedure: COLONOSCOPY WITH PROPOFOL ;  Surgeon: Dessa Reyes ORN, MD;  Location: [facility] ENDOSCOPY;  Service:               Endoscopy;  Laterality: N/A;  BMI    Body Mass Index: 27.91 kg/m      Reproductive/Obstetrics negative OB ROS  Anesthesia Physical Anesthesia Plan  ASA: 3  Anesthesia Plan: General   Post-op Pain Management:    Induction: Intravenous  PONV Risk Score and Plan: Propofol  infusion and TIVA  Airway Management Planned: Natural Airway and Nasal Cannula  Additional Equipment:   Intra-op Plan:   Post-operative Plan:   Informed Consent: I have reviewed the patients History and Physical, chart, labs and discussed the procedure including the risks, benefits and alternatives for the proposed anesthesia with the patient or authorized representative who has indicated his/her understanding and acceptance.     Dental Advisory Given  Plan Discussed with: Anesthesiologist, CRNA and Surgeon  Anesthesia Plan Comments: (Patient consented for risks of anesthesia including but not limited to:  - adverse reactions to medications - risk of airway placement if required - damage to eyes, teeth, lips or other oral mucosa - nerve damage due to positioning  - sore throat or hoarseness - Damage to heart, brain, nerves,  lungs, other parts of body or loss of life  Patient voiced understanding and assent.)        Anesthesia Quick Evaluation

## 2024-11-08 NOTE — Op Note (Signed)
 [facility]astro[facility] Patient Name: Brandy Hamilton Procedure Date: 11/08/2024 7:13 AM MRN: [id number] Account #: [id number] Date of Birth: June 22, 1956 Admit Type: Outpatient Age: 68 Room: [facility] ENDO ROOM 1 Gender: Female Note Status: Finalized Instrument Name: Colon Scope [id number] Procedure:             Colonoscopy Indications:           High risk colon cancer surveillance: Personal history                         of colonic polyps Providers:             Henriette Pierre MD, MD Referring MD:          Anton Blas (Referring MD) Medicines:             Propofol  per Anesthesia Complications:         No immediate complications. Procedure:             Pre-Anesthesia Assessment:                        - After reviewing the risks and benefits, the patient                         was deemed in satisfactory condition to undergo the                         procedure in an ambulatory setting.                        After obtaining informed consent, the colonoscope was                         passed under direct vision. Throughout the procedure,                         the patient's blood pressure, pulse, and oxygen                         saturations were monitored continuously. The                         Colonoscope was introduced through the anus and                         advanced to the the cecum, identified by the ileocecal                         valve. The colonoscopy was technically difficult and                         complex due to a tortuous colon. Successful completion                         of the procedure was aided by withdrawing and                         reinserting the scope. The patient tolerated the  procedure well. The quality of the bowel preparation                         was good. Findings:      Skin tags were found on perianal exam.      Five semi-pedunculated polyps were found in the anus, sigmoid colon and        proximal descending colon. The polyps were 2 to 5 mm in size. These were       biopsied with a cold forceps for histology. Estimated blood loss was       minimal.      A few medium-mouthed diverticula were found in the sigmoid colon. Impression:            - Perianal skin tags found on perianal exam.                        - Four 2 to 5 mm polyps at the anus and in the                         proximal descending colon. Biopsied.                        - Diverticulosis in the sigmoid colon. Recommendation:        - Discharge patient to home.                        - Resume previous diet.                        - Await pathology results.                        - Written discharge instructions were provided to the                         patient. Procedure Code(s):     --- Professional ---                        [phone], Colonoscopy, flexible; with biopsy, single or                         multiple Diagnosis Code(s):     --- Professional ---                        Z86.010, Personal history of colonic polyps                        K62.0, Anal polyp                        D12.4, Benign neoplasm of descending colon                        K64.4, Residual hemorrhoidal skin tags                        K57.30, Diverticulosis of large intestine without                         perforation or abscess  without bleeding CPT copyright 2022 American Medical Association. All rights reserved. The codes documented in this report are preliminary and upon coder review may  be revised to meet current compliance requirements. Dr. Henriette Sevin, MD Henriette Pierre MD, MD 11/08/2024 8:21:40 AM This report has been signed electronically. Number of Addenda: 0 Note Initiated On: 11/08/2024 7:13 AM Scope Withdrawal Time: 0 hours 16 minutes 5 seconds  Total Procedure Duration: 0 hours 31 minutes 22 seconds  Estimated Blood Loss:  Estimated blood loss was minimal.      [facility]

## 2024-11-08 NOTE — Anesthesia Postprocedure Evaluation (Signed)
 Anesthesia Post Note  Patient: Brandy Hamilton  Procedure(s) Performed: COLONOSCOPY  Patient location during evaluation: Endoscopy Anesthesia Type: General Level of consciousness: awake and alert Pain management: pain level controlled Vital Signs Assessment: post-procedure vital signs reviewed and stable Respiratory status: spontaneous breathing, nonlabored ventilation and respiratory function stable Cardiovascular status: blood pressure returned to baseline and stable Postop Assessment: no apparent nausea or vomiting Anesthetic complications: no   No notable events documented.   Last Vitals:  Vitals:   11/08/24 0830 11/08/24 0835  BP:    Pulse: 68 67  Resp: (!) 27 (!) 22  Temp:    SpO2: 94% 94%    Last Pain:  Vitals:   11/08/24 0817  TempSrc: Temporal  PainSc: 0-No pain                 Fairy POUR Arihanna Estabrook

## 2024-11-08 NOTE — Transfer of Care (Signed)
 Immediate Anesthesia Transfer of Care Note  Patient: Brandy Hamilton  Procedure(s) Performed: COLONOSCOPY  Patient Location: PACU  Anesthesia Type:MAC  Level of Consciousness: awake, oriented, and patient cooperative  Airway & Oxygen Therapy: Patient Spontanous Breathing and Patient connected to nasal cannula oxygen  Post-op Assessment: Report given to RN, Post -op Vital signs reviewed and stable, and Patient moving all extremities  Post vital signs: Reviewed and stable  Last Vitals:  Vitals Value Taken Time  BP 84/53 11/08/24 08:16  Temp    Pulse 68 11/08/24 08:18  Resp 14 11/08/24 08:18  SpO2 91 % 11/08/24 08:18  Vitals shown include unfiled device data.  Last Pain:  Vitals:   11/08/24 0645  PainSc: 0-No pain         Complications: No notable events documented.

## 2024-11-09 LAB — SURGICAL PATHOLOGY

## 2024-11-13 ENCOUNTER — Encounter: Payer: Self-pay | Admitting: Family Medicine

## 2024-11-23 ENCOUNTER — Other Ambulatory Visit: Payer: Self-pay

## 2024-11-23 ENCOUNTER — Other Ambulatory Visit: Payer: Self-pay | Admitting: Family Medicine

## 2024-11-23 ENCOUNTER — Ambulatory Visit: Payer: Self-pay

## 2024-11-23 MED ORDER — NYSTATIN 100000 UNIT/GM EX CREA: 1.0000 | TOPICAL_CREAM | Freq: Two times a day (BID) | CUTANEOUS | 0 refills | Status: DC

## 2024-11-23 MED ORDER — NYSTATIN 100000 UNIT/GM EX CREA: 1.0000 | TOPICAL_CREAM | Freq: Two times a day (BID) | CUTANEOUS | 0 refills | Status: AC

## 2024-11-23 NOTE — Telephone Encounter (Signed)
 FYI Only or Action Required?: Action required by provider: update on patient condition.  Patient was last seen in primary care on 10/11/2024 by Rilla Baller, MD.  Called Nurse Triage reporting Rash and Pruritis.  Symptoms began several months ago.  Interventions attempted: OTC medications: Lotrimin.  Symptoms are: gradually worsening. She states she held her omeprazole  last night and thinks the rash has decreased but itching it worsened.  Triage Disposition: See PCP When Office is Open (Within 3 Days)  Patient/caregiver understands and will follow disposition?: No, wishes to speak with PCP            Copied from CRM #[id number]. Topic: Clinical - Medical Advice >> Nov 22, 2024  2:46 PM Joesph NOVAK wrote: Reason for CRM:  Patient has Itching under breast and her stomach.. Its spreading, using lotrimin .SABRA Getting worse. She wants to know what she can use. Any over the counter medication? Or cream that can be prescribed? [phone]. Reason for Disposition  Mild widespread rash  (Exception: Heat rash lasting 3 days or less.)  Answer Assessment - Initial Assessment Questions 1. APPEARANCE of RASH: What does the rash look like? (e.g., blisters, dry flaky skin, red spots, redness, sores)     Red splotches/patches oblong in shape and some with little bumps.  2. SIZE: How big are the spots? (e.g., tip of pen, eraser, coin; inches, centimeters)     Right leg is about the size of her full hand, under both breasts, she states yesterday it was down her entire left side of her abdomen but looks improved since stopping her omeprazole  last night.  3. LOCATION: Where is the rash located?     Under breasts, on right leg, abdomen and she thinks now spreading to her arms.  4. COLOR: What color is the rash? (Note: It is difficult to assess rash color in people with darker-colored skin. When this situation occurs, simply ask the caller to describe what they see.)     Red, not like a  sunburn.  5. ONSET: When did the rash begin?     October. She was seen 10/11/24 by PCP.  6. FEVER: Do you have a fever? If Yes, ask: What is your temperature, how was it measured, and when did it start?     No.  7. ITCHING: Does the rash itch? If Yes, ask: How bad is the itch? (Scale 1-10; or mild, moderate, severe)     Yes, moderate and worsening.   8. CAUSE: What do you think is causing the rash?     Unsure, was told it looked fungal by PCP and has been using lotrimin.  9. MEDICINE FACTORS: Have you started any new medicines within the last 2 weeks? (e.g., antibiotics)      Patient thinks this could be a reaction to her omeprazole . She states she had switched her laundry detergent once prior to the rash but only once and discontinued when she noticed the rash.  10. OTHER SYMPTOMS: Do you have any other symptoms? (e.g., dizziness, headache, sore throat, joint pain)       Right ankle swelling (x several months). Denies difficulty breathing or additional symptoms.  Patient declined coming in for office visit due to she states she is still fighting with insurance about billing from her last visit (she was billed for her lab work and was told it would've been free if office drew the blood work on day of visit instead of in advance and then also received a bill for  the office visit). She would like PCP to review, RN offered office visits and patient declined, wants PCP to review first.  Protocols used: Rash or Redness - [facility]

## 2024-11-23 NOTE — Progress Notes (Signed)
 ERx nystatin

## 2024-11-23 NOTE — Telephone Encounter (Signed)
 Called patient she asked that we resend to [facility]. She will call back if any questions or needs to set up office visit.

## 2024-11-23 NOTE — Telephone Encounter (Signed)
 Would recommend try nystatin cream which I have sent to her [facility] to treat candidal intertrigo. If not improving with this after 1-2 wks, recommend OV for re evaluation, sooner if worsening,  vs dermatology referral.

## 2024-11-24 ENCOUNTER — Inpatient Hospital Stay
Admission: RE | Admit: 2024-11-24 | Discharge: 2024-11-24 | Disposition: A | Source: Ambulatory Visit | Attending: Acute Care | Admitting: Acute Care

## 2024-11-24 DIAGNOSIS — Z87891 Personal history of nicotine dependence: Secondary | ICD-10-CM

## 2024-11-24 DIAGNOSIS — Z122 Encounter for screening for malignant neoplasm of respiratory organs: Secondary | ICD-10-CM

## 2024-11-24 DIAGNOSIS — F17210 Nicotine dependence, cigarettes, uncomplicated: Secondary | ICD-10-CM

## 2024-11-27 ENCOUNTER — Telehealth: Payer: Self-pay | Admitting: Family Medicine

## 2024-11-27 NOTE — Telephone Encounter (Signed)
 Copied from CRM [id number]. Topic: General - Billing Inquiry >> Nov 27, 2024 10:53 AM Charolett L wrote: Reason for CRM: Patient stated that she has questions about her billing and what's she being charged for treatment  and that everything should have been charge as an annual physical. Patient is requesting a call back

## 2024-11-29 ENCOUNTER — Other Ambulatory Visit: Payer: Self-pay

## 2024-11-29 ENCOUNTER — Encounter: Payer: Self-pay | Admitting: Family Medicine

## 2024-11-29 ENCOUNTER — Ambulatory Visit: Admitting: Family Medicine

## 2024-11-29 VITALS — BP 128/80 | HR 48 | Temp 98.6°F | Ht 62.0 in | Wt 153.8 lb

## 2024-11-29 DIAGNOSIS — Z8249 Family history of ischemic heart disease and other diseases of the circulatory system: Secondary | ICD-10-CM | POA: Diagnosis not present

## 2024-11-29 DIAGNOSIS — Z87891 Personal history of nicotine dependence: Secondary | ICD-10-CM

## 2024-11-29 DIAGNOSIS — E785 Hyperlipidemia, unspecified: Secondary | ICD-10-CM | POA: Diagnosis not present

## 2024-11-29 DIAGNOSIS — Z122 Encounter for screening for malignant neoplasm of respiratory organs: Secondary | ICD-10-CM

## 2024-11-29 DIAGNOSIS — R21 Rash and other nonspecific skin eruption: Secondary | ICD-10-CM

## 2024-11-29 DIAGNOSIS — F17210 Nicotine dependence, cigarettes, uncomplicated: Secondary | ICD-10-CM

## 2024-11-29 MED ORDER — DOXYCYCLINE HYCLATE 100 MG PO TABS: 100.0000 mg | ORAL_TABLET | Freq: Two times a day (BID) | ORAL | 0 refills | Status: AC

## 2024-11-29 MED ORDER — TRIAMCINOLONE ACETONIDE 0.1 % EX CREA: 1.0000 | TOPICAL_CREAM | Freq: Two times a day (BID) | CUTANEOUS | 0 refills | Status: AC

## 2024-11-29 NOTE — Progress Notes (Signed)
 Ph: [phone] Fax: [phone]   Patient ID: Brandy Hamilton, female    DOB: 30-Mar-1956, 68 y.o.   MRN: [id number]  This visit was conducted in person.  BP 128/80   Pulse (!) 48   Temp 98.6 F (37 C) (Oral)   Ht 5' 2 (1.575 m)   Wt 153 lb 12.8 oz (69.8 kg)   SpO2 97%   BMI 28.13 kg/m    CC: skin rash Subjective:   HPI: Brandy Hamilton is a 68 y.o. female presenting on 11/29/2024 for Acute Visit (Both legs, left arm, breast, and stomach /Itchy red bumps  that continues to spread over pts skin.//Hot to the touch and swelling in legs, medication has not  been helping)   New rash started 10/04/2024 under breast as well as to R leg - concern for tinea fungal infection treated with OTC clotrimazole, continued aquaphor. Did not improve so treated with nystatin  cream - has used for the past 5 days without significant benefit.  Rash is very itchy and right leg gets red and warm.   She has been using Clinique moisturizer to skin. She stopped aquaphor.   Rash may have started after she used new razors from [employer] without using shaving cream.  No new lotions, detergents, soaps or shampoos.  No new medicines, besides omeprazole . She stopped omeprazole  without benefit so plans to restart this.  No new supplements, vitamins or OTC - she stopped most of her supplements without benefit.  No fevers/chills, abd pain, nausea, new joint pains, recent tick bites.   Notes h/o IBS.   She has implemented healthy diet changes based on our previous discussion on her labwork - more salmon.   She is also upset today because she received several bills during recent lab/physical office visit.      Relevant past medical, surgical, family and social history reviewed and updated as indicated. Interim medical history since our last visit reviewed. Allergies and medications reviewed and updated. Outpatient Medications Prior to Visit  Medication Sig Dispense Refill   amLODipine  (NORVASC ) 10 MG tablet  Take 1 tablet (10 mg total) by mouth daily. 90 tablet 3   benzonatate  (TESSALON ) 200 MG capsule Take 1 capsule (200 mg total) by mouth 3 (three) times daily as needed for cough. 60 capsule 1   bisoprolol -hydrochlorothiazide  (ZIAC ) 10-6.25 MG tablet Take 1 tablet by mouth daily. 90 tablet 3   cetirizine  (ZYRTEC ) 10 MG tablet TAKE 1 TABLET BY MOUTH EVERY DAY 90 tablet 3   hyoscyamine  (LEVSIN ) 0.125 MG tablet Take 1 tablet (0.125 mg total) by mouth 3 (three) times daily as needed. Before meals 30 tablet 1   levalbuterol  (XOPENEX  HFA) 45 MCG/ACT inhaler Inhale 1-2 puffs into the lungs every 6 (six) hours as needed. 1 each 1   losartan  (COZAAR ) 100 MG tablet Take 1 tablet (100 mg total) by mouth daily. 90 tablet 3   montelukast  (SINGULAIR ) 10 MG tablet TAKE 1 TABLET AT BEDTIME 90 tablet 3   nystatin  cream (MYCOSTATIN ) Apply 1 Application topically 2 (two) times daily. 60 g 0   Omega-3 Fatty Acids (FISH OIL PO) Take by mouth.     omeprazole  (PRILOSEC) 20 MG capsule Take 1 capsule (20 mg total) by mouth at bedtime. 90 capsule 3   Potassium 75 MG TABS Take by mouth daily.     rOPINIRole  (REQUIP ) 0.5 MG tablet Take 1 tablet (0.5 mg total) by mouth at bedtime. 90 tablet 3   Calcium  Carbonate-Vit D-Min (CALCIUM -VITAMIN D -MINERALS)  600-400 MG-UNIT CHEW Chew 1 tablet by mouth daily. (Patient not taking: Reported on 11/29/2024)     Cholecalciferol (VITAMIN D3) 25 MCG (1000 UT) CAPS Take 1 capsule (1,000 Units total) by mouth daily. (Patient not taking: Reported on 11/29/2024)     No facility-administered medications prior to visit.     Per HPI unless specifically indicated in ROS section below Review of Systems  Objective:  BP 128/80   Pulse (!) 48   Temp 98.6 F (37 C) (Oral)   Ht 5' 2 (1.575 m)   Wt 153 lb 12.8 oz (69.8 kg)   SpO2 97%   BMI 28.13 kg/m   Wt Readings from Last 3 Encounters:  11/29/24 153 lb 12.8 oz (69.8 kg)  11/08/24 152 lb 9.6 oz (69.2 kg)  10/11/24 153 lb 4 oz (69.5 kg)       Physical Exam Vitals and nursing note reviewed.  Constitutional:      Appearance: Normal appearance. She is not ill-appearing.  Skin:    General: Skin is warm and dry.     Findings: Erythema and rash present.     Comments:  Very pruritic maculopapular rash with erosions and excoriations to R>LLE, BUE also involved.  Rash present to base of scalp into neck as well as to anterior trunk below L>R breast into central abdomen   Neurological:     Mental Status: She is alert.          Lab Results  Component Value Date   NA 142 10/04/2024   CL 105 10/04/2024   K 3.9 10/04/2024   CO2 27 10/04/2024   BUN 19 10/04/2024   CREATININE 0.83 10/04/2024   GFR 73.54 10/04/2024   CALCIUM  9.8 10/04/2024   PHOS 3.8 05/04/2008   ALBUMIN 4.2 10/04/2024   GLUCOSE 129 (H) 10/04/2024    Lab Results  Component Value Date   ALT 13 10/04/2024   AST 16 10/04/2024   ALKPHOS 80 10/04/2024   BILITOT 0.7 10/04/2024    Lab Results  Component Value Date   WBC 8.9 10/04/2024   HGB 14.2 10/04/2024   HCT 41.7 10/04/2024   MCV 96.2 10/04/2024   PLT 252.0 10/04/2024    Lab Results  Component Value Date   TSH 0.99 07/13/2013   Lab Results  Component Value Date   IRON 157 (H) 10/04/2024   TIBC 291.2 10/04/2024   FERRITIN 281.6 10/04/2024    No results found for: 25OHVITD2, 25OHVITD3, VD25OH   Assessment & Plan:   Problem List Items Addressed This Visit     Dyslipidemia   She has implemented significant healthy diet changes. Update lipid panel today.       Iron excess   Incidentally noted on physical labwork - iron panel initially obtained in h/o RLS to r/o iron deficiency.  She denies fmhx iron overload.  Repeat iron panel today - if abnormal, check hereditary hemochromatosis PCR.       Skin rash - Primary   Chronic issue, present since 09/2024.  Initially treated for tinea/yeast with clotrimazole then nystatin  topical without improvement.  Rash may have started after  using new razors - ?bacterial folliculitis - treat with oral antibiotic doxycycline course, add topical triamcinolone for right leg dermatitis.  No significant systemic symptoms.  R/o scabies. Update labs today including TSH.  If no improved with above, will recommend referral for dermatology evaluation.       Relevant Orders   CBC with Differential/Platelet   Comprehensive metabolic panel with  GFR   Family history of premature CAD   Update Lp(a).         Meds ordered this encounter  Medications   doxycycline (VIBRA-TABS) 100 MG tablet    Sig: Take 1 tablet (100 mg total) by mouth 2 (two) times daily.    Dispense:  20 tablet    Refill:  0   triamcinolone cream (KENALOG) 0.1 %    Sig: Apply 1 Application topically 2 (two) times daily. Apply to AA.    Dispense:  60 g    Refill:  0    Orders Placed This Encounter  Procedures   CBC with Differential/Platelet   Comprehensive metabolic panel with GFR    Patient Instructions  Update labs today  Possible skin infection (folliculitis)  Treat with doxycycline antibiotic 10 day course to cover infection - caution with sun exposure.  May try triamcinolone topical steroid sent to pharmacy, twice daily for 5-7 days at a time.  May continue moisturizer cream to arms and legs   Follow up plan: No follow-ups on file.  Anton Blas, MD

## 2024-11-29 NOTE — Patient Instructions (Addendum)
 Update labs today  Possible skin infection (folliculitis)  Treat with doxycycline antibiotic 10 day course to cover infection - caution with sun exposure.  May try triamcinolone topical steroid sent to pharmacy, twice daily for 5-7 days at a time.  May continue moisturizer cream to arms and legs

## 2024-11-30 ENCOUNTER — Ambulatory Visit (INDEPENDENT_AMBULATORY_CARE_PROVIDER_SITE_OTHER)

## 2024-11-30 DIAGNOSIS — R21 Rash and other nonspecific skin eruption: Secondary | ICD-10-CM

## 2024-11-30 LAB — LIPID PANEL
Cholesterol: 247 mg/dL — ABNORMAL HIGH (ref 0–200)
HDL: 41.7 mg/dL (ref >39.00)
LDL Cholesterol: 151 mg/dL — ABNORMAL HIGH (ref 0–99)
NonHDL: 204.9
Total CHOL/HDL Ratio: 6
Triglycerides: 270 mg/dL — ABNORMAL HIGH (ref 0.0–149.0)
VLDL: 54 mg/dL — ABNORMAL HIGH (ref 0.0–40.0)

## 2024-11-30 LAB — IBC PANEL
Iron: 164 ug/dL — ABNORMAL HIGH (ref 42–145)
Saturation Ratios: 55.3 % — ABNORMAL HIGH (ref 20.0–50.0)
TIBC: 296.8 ug/dL (ref 250.0–450.0)
Transferrin: 212 mg/dL (ref 212.0–360.0)

## 2024-11-30 LAB — CBC WITH DIFFERENTIAL/PLATELET
Basophils Absolute: 0 K/uL (ref 0.0–0.1)
Basophils Relative: 0.4 % (ref 0.0–3.0)
Eosinophils Absolute: 0.9 K/uL — ABNORMAL HIGH (ref 0.0–0.7)
Eosinophils Relative: 9.1 % — ABNORMAL HIGH (ref 0.0–5.0)
HCT: 44.4 % (ref 36.0–46.0)
Hemoglobin: 15 g/dL (ref 12.0–15.0)
Lymphocytes Relative: 18.1 % (ref 12.0–46.0)
Lymphs Abs: 1.8 K/uL (ref 0.7–4.0)
MCHC: 33.7 g/dL (ref 30.0–36.0)
MCV: 95.9 fl (ref 78.0–100.0)
Monocytes Absolute: 0.4 K/uL (ref 0.1–1.0)
Monocytes Relative: 4.6 % (ref 3.0–12.0)
Neutro Abs: 6.6 K/uL (ref 1.4–7.7)
Neutrophils Relative %: 67.8 % (ref 43.0–77.0)
Platelets: 295 K/uL (ref 150.0–400.0)
RBC: 4.63 Mil/uL (ref 3.87–5.11)
RDW: 12.6 % (ref 11.5–15.5)
WBC: 9.7 K/uL (ref 4.0–10.5)

## 2024-11-30 LAB — TSH: TSH: 1.69 u[IU]/mL (ref 0.35–5.50)

## 2024-11-30 LAB — SEDIMENTATION RATE: Sed Rate: 19 mm/h (ref 0–30)

## 2024-11-30 LAB — FERRITIN: Ferritin: 323.1 ng/mL — ABNORMAL HIGH (ref 10.0–291.0)

## 2024-11-30 NOTE — Assessment & Plan Note (Addendum)
 Chronic issue, present since 09/2024.  Initially treated for tinea/yeast with clotrimazole then nystatin  topical without improvement.  Rash may have started after using new razors - ?bacterial folliculitis - treat with oral antibiotic doxycycline course, add topical triamcinolone for right leg dermatitis.  No significant systemic symptoms.  R/o scabies. Update labs today including TSH.  If no improved with above, will recommend referral for dermatology evaluation.

## 2024-11-30 NOTE — Assessment & Plan Note (Signed)
 Update Lpa

## 2024-11-30 NOTE — Assessment & Plan Note (Signed)
 Incidentally noted on physical labwork - iron panel initially obtained in h/o RLS to r/o iron deficiency.  She denies fmhx iron overload.  Repeat iron panel today - if abnormal, check hereditary hemochromatosis PCR.

## 2024-11-30 NOTE — Assessment & Plan Note (Signed)
 She has implemented significant healthy diet changes. Update lipid panel today.

## 2024-11-30 NOTE — Addendum Note (Signed)
 Addended by: HOPE VEVA PARAS on: 11/30/2024 10:21 AM   Modules accepted: Orders

## 2024-12-04 ENCOUNTER — Ambulatory Visit: Payer: Self-pay | Admitting: Family Medicine

## 2024-12-04 DIAGNOSIS — M85851 Other specified disorders of bone density and structure, right thigh: Secondary | ICD-10-CM

## 2024-12-04 DIAGNOSIS — E785 Hyperlipidemia, unspecified: Secondary | ICD-10-CM

## 2024-12-04 DIAGNOSIS — R21 Rash and other nonspecific skin eruption: Secondary | ICD-10-CM

## 2024-12-08 LAB — HEMOCHROMATOSIS DNA-PCR(C282Y,H63D)

## 2024-12-08 LAB — LIPOPROTEIN A (LPA): Lipoprotein (a): 280 nmol/L — ABNORMAL HIGH (ref <75)

## 2024-12-11 NOTE — Addendum Note (Signed)
 Addended by: RILLA BALLER on: 12/11/2024 07:33 AM   Modules accepted: Orders

## 2024-12-12 ENCOUNTER — Ambulatory Visit
Admission: RE | Admit: 2024-12-12 | Discharge: 2024-12-12 | Disposition: A | Discharge status: Home / Self Care | Source: Ambulatory Visit | Attending: Family Medicine | Admitting: Family Medicine

## 2024-12-12 DIAGNOSIS — Z1231 Encounter for screening mammogram for malignant neoplasm of breast: Secondary | ICD-10-CM | POA: Diagnosis present

## 2024-12-12 DIAGNOSIS — E2839 Other primary ovarian failure: Secondary | ICD-10-CM

## 2024-12-18 DIAGNOSIS — M8580 Other specified disorders of bone density and structure, unspecified site: Secondary | ICD-10-CM | POA: Insufficient documentation

## 2024-12-18 NOTE — Addendum Note (Signed)
 Addended by: RILLA BALLER on: 12/18/2024 07:55 AM   Modules accepted: Orders

## 2024-12-20 ENCOUNTER — Inpatient Hospital Stay

## 2024-12-20 ENCOUNTER — Inpatient Hospital Stay: Attending: Internal Medicine | Admitting: Internal Medicine

## 2024-12-20 ENCOUNTER — Encounter: Payer: Self-pay | Admitting: Internal Medicine

## 2024-12-20 VITALS — BP 160/94 | HR 55 | Temp 98.6°F | Resp 19 | Ht 62.0 in | Wt 147.6 lb

## 2024-12-20 DIAGNOSIS — F17210 Nicotine dependence, cigarettes, uncomplicated: Secondary | ICD-10-CM | POA: Diagnosis not present

## 2024-12-20 DIAGNOSIS — R7989 Other specified abnormal findings of blood chemistry: Secondary | ICD-10-CM

## 2024-12-20 NOTE — Assessment & Plan Note (Deleted)
" °#   Hereditary hemochromatosis-double heterozygous; currently asymptomatic no evidence of organ dysfunction. FERRITIN: ; Iron saturations:    #I had a long discussion the patient regarding the natural history of untreated homozygous hereditary hemochromatosis-[which patient does not have] when unchecked iron levels could cause deposition in the skin/pigmentation; pancreas/diabetes; liver/cirrhosis; joints/arthralgias; cardiac/arrhythmia;  hypothalamus/testis-fatigue hypogonadism etc.    #I reemphasized with the patient above complications are extremely rare; and they tend to happen when extreme iron levels go unchecked for years. Discussed treatments include phlebotomy every 6 months or so; to keep the ferritin 100-150.    HETEROZYGOUS FOR THE C282Y AND H63D PATHOGENIC  VARIANTS  .  Interpretation: One copy each of the C282Y and H63D  pathogenic variants in HFE gene was detected. Approximately  3%-8% of individuals with a biochemical diagnosis of  hereditary hemochromatosis (HH) have this genotype.  Therefore, this result is consistent with a diagnosis of HH  in an individual with clinical evidence of HH. However, this  genotype does not predict a diagnosis of HH in an  asymptomatic individual, as only 0.5% to 2% of individuals  with this genotype will develop symptoms or clinical  evidence of this disorder. Disease diagnosis can only be  made by demonstration of elevated iron stores. Genetic  counseling is recommended to discuss the potential clinical  implications of this result.      #Genetics: Discussed genetic implications-children/siblings.    Thank you Dr. Ishmael for allowing me to participate in the care of your pleasant patient. Please do not hesitate to contact me with questions or concerns in the interim.    # DISPOSITION: # no labs today # follow up in 12 months- MD; 2-3 days- labs- cbc/bmp; iron studies;ferritin- Dr.B "

## 2024-12-20 NOTE — Progress Notes (Signed)
 [facility] CONSULT NOTE  Patient Care Team: Rilla Baller, MD as PCP - General (Family Medicine) Rennie Cindy SAUNDERS, MD as Consulting Physician (Oncology)  CHIEF COMPLAINTS/PURPOSE OF CONSULTATION: Hereditary hemochromatosis/Elevated Ferritn.    HEMATOLOGY HISTORY  #  FERRITIN:  Iron sat:  HISTORY OF PRESENTING ILLNESS:  Brandy Hamilton 68 y.o.  female has been referred to us  for further evaluation/work-up/ recommendations for elevated studies-ferritin/abnormal hemochromatosis genotype.  Discussed the use of AI scribe software for clinical note transcription with the patient, who gave verbal consent to proceed.  History of Present Illness   Brandy Hamilton is a 68 year old woman with newly diagnosed hereditary hemochromatosis who presents for evaluation and management of iron overload.  Iron overload was first identified in October during routine laboratory testing for restless leg syndrome, revealing markedly elevated iron studies. Repeat testing showed persistently elevated iron levels, and genetic testing revealed an abnormal gene associated with iron overload. She has not reported symptoms such as fatigue, heart failure, diabetes, or hepatic dysfunction. She has never donated blood and does not take iron supplements. She was advised to avoid iron-containing supplements and has made minor dietary modifications, such as occasionally choosing salmon over steak, but otherwise continues her usual diet.  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 Restless leg syndrome prompted the initial laboratory evaluation, but she has not reported new or worsening symptoms related to this condition.  In October, she developed a pruritic, dry rash on her legs and abdomen, which improved significantly following a 10-day course of doxycycline  and  topical corticosteroids. The rash is now mostly resolved, with only mild residual pruritus and dryness. She is scheduled for dermatology evaluation in April. There are no infectious symptoms or open lesions associated with the rash.  Recent mammogram and colonoscopy were normal. She underwent a CT scan and is scheduled for a cardiac CT scan later this week. Liver and kidney function tests have been normal, and hemoglobin has remained within normal limits. She has no personal or family history of liver disease, liver cancer, or iron overload.      Review of Systems  Constitutional:  Negative for chills, diaphoresis, fever, malaise/fatigue and weight loss.  HENT:  Negative for nosebleeds and sore throat.   Eyes:  Negative for double vision.  Respiratory:  Negative for cough, hemoptysis, sputum production, shortness of breath and wheezing.   Cardiovascular:  Negative for chest pain, palpitations, orthopnea and leg swelling.  Gastrointestinal:  Negative for abdominal pain, blood in stool, constipation, diarrhea, heartburn, melena, nausea and vomiting.  Genitourinary:  Negative for dysuria, frequency and urgency.  Musculoskeletal:  Negative for back pain and joint pain.  Skin: Negative.  Negative for itching and rash.  Neurological:  Negative for dizziness, tingling, focal weakness, weakness and headaches.  Endo/Heme/Allergies:  Does not bruise/bleed easily.  Psychiatric/Behavioral:  Negative for depression. The patient is not nervous/anxious and does not have insomnia.     MEDICAL HISTORY:  Past Medical History:  Diagnosis Date   Allergy    pollen, seasonal, perfumes, medications   Asthma    Breast cancer (HCC) 2008   radiation   Cancer (HCC) 11/07/2007   left partial mastectomy done 12/12/2007-DCIS ER/PR neg,positive margins identified to re-excisione done    DCIS (ductal carcinoma in situ) of breast    partial mastectomy and RT-----------------Dr Byrnett   Dry eye    Hyperlipidemia     Hypertension  1998   Personal history of tobacco use, presenting hazards to health    Special screening for malignant neoplasms, colon     SURGICAL HISTORY: Past Surgical History:  Procedure Laterality Date   BELPHAROPTOSIS REPAIR Bilateral 01/2017   BREAST BIOPSY Left    1998    Breast biopsy negative - Byrnett, 3/00     Left breast biopsy- stereotactic10/05   Left breast biopsy - (-) Byrnett11/08   Left breast biopsy--L partial mastectomy for DCIS, ,    BREAST EXCISIONAL BIOPSY Left 2008   positive   BREAST SURGERY Left 11/07/2007   3 cm area of DCIS, resected the negative margins. PR: 0%; ER less than 5%. Treated by whole breast radiation, patient declined antiestrogen therapy.   COLONOSCOPY  2009   hyperplastic polyp from rectum, Dr. dessa   COLONOSCOPY N/A 11/08/2024   TAx2, HP, diverticulosis Thurlow, Isami, DO)   COLONOSCOPY WITH PROPOFOL  N/A 08/31/2018   Procedure: COLONOSCOPY WITH PROPOFOL ;  Surgeon: Dessa Reyes ORN, MD;  Location: [facility] END[facility];  Service: Endoscopy;  Laterality: N/A;   COLONOSCOPY WITH PROPOFOL  N/A 10/29/2021   Procedure: COLONOSCOPY WITH PROPOFOL ;  Surgeon: Dessa Reyes ORN, MD;  Location: [facility] END[facility]COPY;  Service: Endoscopy;  Laterality: N/A;    SOCIAL HISTORY: Social History   Socioeconomic History   Marital status: Divorced    Spouse name: Not on file   Number of children: 0   Years of education: Not on file   Highest education level: Not on file  Occupational History   Occupation: Training And Development Officer - [employer]    Comment: retired   Occupation: Recruitment Consultant, taxes---part time    Comment:     Occupation: Rental properties   Occupation:    Tobacco Use   Smoking status: Every Day    Current packs/day: 0.50    Average packs/day: 0.5 packs/day for 48.0 years (24.0 ttl pk-yrs)    Types: Cigarettes    Start date: 1978    Passive exposure: Never   Smokeless tobacco: Never   Tobacco comments:    discussed cold turkey stopping since  not an everyday smoker  Vaping Use   Vaping status: Never Used  Substance and Sexual Activity   Alcohol use: Yes    Comment: drinks rarely   Drug use: Never   Sexual activity: Not on file  Other Topics Concern   Not on file  Social History Narrative   Has living will   Requests friend Bruna Fortes as health care POA   Would accept resuscitation attempts   No tube feeds   Social Drivers of Health   Tobacco Use: High Risk (12/20/2024)   Patient History    Smoking Tobacco Use: Every Day    Smokeless Tobacco Use: Never    Passive Exposure: Never  Financial Resource Strain: Not on file  Food Insecurity: No Food Insecurity (12/20/2024)   Epic    Worried About Programme Researcher, Broadcasting/film/video in the Last Year: Never true    Ran Out of Food in the Last Year: Never true  Transportation Needs: No Transportation Needs (12/20/2024)   Epic    Lack of Transportation (Medical): No    Lack of Transportation (Non-Medical): No  Physical Activity: Not on file  Stress: Not on file  Social Connections: Not on file  Intimate Partner Violence: Not At Risk (12/20/2024)   Epic    Fear of Current or Ex-Partner: No    Emotionally Abused: No    Physically Abused: No    Sexually  Abused: No  Depression (PHQ2-9): Low Risk (12/20/2024)   Depression (PHQ2-9)    PHQ-2 Score: 0  Alcohol Screen: Not on file  Housing: Low Risk (12/20/2024)   Epic    Unable to Pay for Housing in the Last Year: No    Number of Times Moved in the Last Year: 0    Homeless in the Last Year: No  Utilities: Not At Risk (12/20/2024)   Epic    Threatened with loss of utilities: No  Health Literacy: Not on file    FAMILY HISTORY: Family History  Problem Relation Age of Onset   Heart disease Mother    Hypertension Mother    Diabetes Father    Cancer Maternal Grandfather    Cancer Other        breast cancer   Breast cancer Neg Hx     ALLERGIES:  is allergic to beclomethasone dipropionate, levofloxacin, and  ramipril.  MEDICATIONS:  Current Outpatient Medications  Medication Sig Dispense Refill   amLODipine  (NORVASC ) 10 MG tablet Take 1 tablet (10 mg total) by mouth daily. 90 tablet 3   benzonatate  (TESSALON ) 200 MG capsule Take 1 capsule (200 mg total) by mouth 3 (three) times daily as needed for cough. 60 capsule 1   bisoprolol -hydrochlorothiazide  (ZIAC ) 10-6.25 MG tablet Take 1 tablet by mouth daily. 90 tablet 3   Cholecalciferol (D3) 50 MCG (2000 UT) CHEW Chew by mouth.     hyoscyamine  (LEVSIN ) 0.125 MG tablet Take 1 tablet (0.125 mg total) by mouth 3 (three) times daily as needed. Before meals 30 tablet 1   levalbuterol  (XOPENEX  HFA) 45 MCG/ACT inhaler Inhale 1-2 puffs into the lungs every 6 (six) hours as needed. 1 each 1   losartan  (COZAAR ) 100 MG tablet Take 1 tablet (100 mg total) by mouth daily. 90 tablet 3   montelukast  (SINGULAIR ) 10 MG tablet TAKE 1 TABLET AT BEDTIME 90 tablet 3   nystatin  cream (MYCOSTATIN ) Apply 1 Application topically 2 (two) times daily. 60 g 0   Omega-3 Fatty Acids (FISH OIL PO) Take by mouth.     omeprazole  (PRILOSEC) 20 MG capsule Take 1 capsule (20 mg total) by mouth at bedtime. 90 capsule 3   Potassium 75 MG TABS Take by mouth daily.     rOPINIRole  (REQUIP ) 0.5 MG tablet Take 1 tablet (0.5 mg total) by mouth at bedtime. 90 tablet 3   S-Adenosylmethionine (SAM-E) 200 MG CAPS Take by mouth.     triamcinolone  cream (KENALOG ) 0.1 % Apply 1 Application topically 2 (two) times daily. Apply to AA. 60 g 0   Calcium  Carbonate-Vit D-Min (CALCIUM -VITAMIN D -MINERALS) 600-400 MG-UNIT CHEW Chew 1 tablet by mouth daily. (Patient not taking: Reported on 12/20/2024)     cetirizine  (ZYRTEC ) 10 MG tablet TAKE 1 TABLET BY MOUTH EVERY DAY (Patient not taking: Reported on 12/20/2024) 90 tablet 3   Cholecalciferol (VITAMIN D3) 25 MCG (1000 UT) CAPS Take 1 capsule (1,000 Units total) by mouth daily. (Patient not taking: Reported on 12/20/2024)     doxycycline  (VIBRA -TABS) 100 MG  tablet Take 1 tablet (100 mg total) by mouth 2 (two) times daily. (Patient not taking: Reported on 12/20/2024) 20 tablet 0   No current facility-administered medications for this visit.      PHYSICAL EXAMINATION:   Vitals:   12/20/24 1048  BP: (!) 160/94  Pulse: (!) 55  Resp: 19  Temp: 98.6 F (37 C)  SpO2: 100%   Filed Weights   12/20/24 1048  Weight: 147 lb  9.6 oz (67 kg)    Physical Exam Vitals and nursing note reviewed.  HENT:     Head: Normocephalic and atraumatic.     Mouth/Throat:     Pharynx: Oropharynx is clear.  Eyes:     Extraocular Movements: Extraocular movements intact.     Pupils: Pupils are equal, round, and reactive to light.  Cardiovascular:     Rate and Rhythm: Normal rate and regular rhythm.  Pulmonary:     Comments: Decreased breath sounds bilaterally.  Abdominal:     Palpations: Abdomen is soft.  Musculoskeletal:        General: Normal range of motion.     Cervical back: Normal range of motion.  Skin:    General: Skin is warm.  Neurological:     General: No focal deficit present.     Mental Status: She is alert and oriented to person, place, and time.  Psychiatric:        Behavior: Behavior normal.        Judgment: Judgment normal.     LABORATORY DATA:  I have reviewed the data as listed Lab Results  Component Value Date   WBC 9.7 11/29/2024   HGB 15.0 11/29/2024   HCT 44.4 11/29/2024   MCV 95.9 11/29/2024   PLT 295.0 11/29/2024   Recent Labs    10/04/24 1025  NA 142  K 3.9  CL 105  CO2 27  GLUCOSE 129*  BUN 19  CREATININE 0.83  CALCIUM  9.8  PROT 6.7  ALBUMIN 4.2  AST 16  ALT 13  ALKPHOS 80  BILITOT 0.7     MM 3D SCREENING MAMMOGRAM BILATERAL BREAST Result Date: 12/20/2024 CLINICAL DATA:  Screening. EXAM: DIGITAL SCREENING BILATERAL MAMMOGRAM WITH TOMOSYNTHESIS AND CAD TECHNIQUE: Bilateral screening digital craniocaudal and mediolateral oblique mammograms were obtained. Bilateral screening digital breast  tomosynthesis was performed. The images were evaluated with computer-aided detection. COMPARISON:  Previous exam(s). ACR Breast Density Category c: The breasts are heterogeneously dense, which may obscure small masses. FINDINGS: Left breast post-lumpectomy changes. No findings suspicious for malignancy in either breast. IMPRESSION: No mammographic evidence of malignancy. A result letter of this screening mammogram will be mailed directly to the patient. RECOMMENDATION: Screening mammogram in one year. (Code:SM-B-01Y) BI-RADS CATEGORY  2: Benign. Electronically Signed   By: Curtistine Noble   On: 12/20/2024 07:29   DG Bone Density Result Date: 12/12/2024 EXAM: DUAL X-RAY ABSORPTIOMETRY (DXA) FOR BONE MINERAL DENSITY 12/12/2024 1:08 pm CLINICAL DATA:  68 year old Female Postmenopausal. Osteoporosis screening History of fragility fracture. TECHNIQUE: An axial (e.g., hips, spine) and/or appendicular (e.g., radius) exam was performed, as appropriate, using GE Secretary/administrator at [facility]. Images are obtained for bone mineral density measurement and are not obtained for diagnostic purposes. [id number] Exclusions: None. COMPARISON:  None. FINDINGS: Scan quality: Good. LUMBAR SPINE (L1-L4): BMD (in g/cm2): 0.991 T-score: -1.6 Z-score: 0.0 LEFT FEMORAL NECK: BMD (in g/cm2): 0.800 T-score: -1.7 Z-score: -0.2 LEFT TOTAL HIP: BMD (in g/cm2): 0.866 T-score: -1.1 Z-score: 0.1 RIGHT FEMORAL NECK: BMD (in g/cm2): 0.776 T-score: -1.9 Z-score: -0.4 RIGHT TOTAL HIP: BMD (in g/cm2): 0.840 T-score: -1.3 Z-score: -0.1 LEFT FOREARM (RADIUS 33%): BMD (in g/cm2): 0.802 T-score: -0.8 Z-score: 0.6 FRAX 10-YEAR PROBABILITY OF FRACTURE: 10-year fracture risk is performed using the [facility] FRAX calculator based on patient-reported risk factors. Major osteoporotic fracture: 17.8% Hip fracture: 4.3% Other situations known to alter the reliability of the FRAX score should be considered when making  treatment decisions, including chronic glucocorticoid use and past treatments. Further guidance on treatment can be found at the [facility] Osteoporosis Foundation's website [email or web address]/. IMPRESSION: Osteopenia based on BMD. Fracture risk is increased. Increased risk is based on low BMD, FRAX calculation and history of fragility fracture. RECOMMENDATIONS: 1. All patients should optimize calcium  and vitamin D  intake. 2. Consider FDA-approved medical therapies in postmenopausal women and men aged 2 years and older, based on the following: - A hip or vertebral (clinical or morphometric) fracture - T-score less than or equal to -2.5 and secondary causes have been excluded. - Low bone mass (T-score between -1.0 and -2.5) and a 10-year probability of a hip fracture greater than or equal to 3% or a 10-year probability of a major osteoporosis-related fracture greater than or equal to 20% based on the [place] -adapted WHO algorithm. - Clinician judgment and/or patient preferences may indicate treatment for people with 10-year fracture probabilities above or below these levels 3. Patients with diagnosis of osteoporosis or at high risk for fracture should have regular bone mineral density tests. For patients eligible for Medicare, routine testing is allowed once every 2 years. The testing frequency can be increased to one year for patients who have rapidly progressing disease, those who are receiving or discontinuing medical therapy to restore bone mass, or have additional risk factors. Electronically Signed   By: Debby Satterfield M.D.   On: 12/12/2024 13:30   CT CHEST LUNG CA SCREEN LOW DOSE W/O CM Result Date: 11/29/2024 CLINICAL DATA:  68 year old female current smoker with 25 pack-year smoking history EXAM: CT CHEST WITHOUT CONTRAST LOW-DOSE FOR LUNG CANCER SCREENING TECHNIQUE: Multidetector CT imaging of the chest was performed following the standard protocol without IV contrast. RADIATION DOSE REDUCTION: This exam was  performed according to the departmental dose-optimization program which includes automated exposure control, adjustment of the mA and/or kV according to patient size and/or use of iterative reconstruction technique. COMPARISON:  11/24/2023 screening chest CT FINDINGS: Cardiovascular: Normal heart size. No significant pericardial effusion/thickening. Left anterior descending and right coronary atherosclerosis. Atherosclerotic nonaneurysmal thoracic aorta. Normal caliber pulmonary arteries. Mediastinum/Nodes: No significant thyroid  nodules. Unremarkable esophagus. No pathologically enlarged axillary, mediastinal or hilar lymph nodes, noting limited sensitivity for the detection of hilar adenopathy on this noncontrast study. Lungs/Pleura: No pneumothorax. No pleural effusion. Mild centrilobular emphysema with diffuse bronchial wall thickening. No acute consolidative airspace disease or lung masses. Stable 5.6 mm solid left lower lobe pulmonary nodule on image 209. No new significant pulmonary nodules. Upper abdomen: No acute abnormality. Musculoskeletal: No aggressive appearing focal osseous lesions. Mild thoracic spondylosis. IMPRESSION: 1. Lung-RADS 2, benign appearance or behavior. Continue annual screening with low-dose chest CT without contrast in 12 months. 2. Two-vessel coronary atherosclerosis. 3. Aortic Atherosclerosis (ICD10-I70.0) and Emphysema (ICD10-J43.9). Electronically Signed   By: Selinda DELENA Blue M.D.   On: 11/29/2024 15:23    Hereditary hemochromatosis # Hereditary hemochromatosis-HETEROZYGOUS FOR THE C282Y AND H63D PATHOGENIC VARIANTS. Patient is currently asymptomatic no evidence of organ dysfunction. FERRITIN: 200-300; Iron saturations: 50% [DEC 2025]. # I reviewed the genetic testing that it is consistent with a diagnosis of HH  in an individual with ONLY with clinical evidence of HH. However, this  genotype does not predict a diagnosis of HH in an asymptomatic individual, as only 0.5% to 2% of  individuals  with this genotype will develop symptoms or clinical evidence of this disorder.   #I had a long discussion the patient regarding the natural history of untreated homozygous hereditary hemochromatosis-[which patient does  not have] when unchecked iron levels could cause deposition in the skin/pigmentation; pancreas/diabetes; liver/cirrhosis; joints/arthralgias; cardiac/arrhythmia; hypothalamus/testis-fatigue hypogonadism etc.    #I reemphasized with the patient above complications are extremely rare; and they tend to happen when extreme iron levels go unchecked for years. Discussed treatments include phlebotomy every 6-12 months or so; to keep the ferritin < 200.  For now recommend phlebotomy-to help reduce the iron stores.  I think is reasonable to avoid multivitamin that has iron in it; however would not recommend any major dietary changes that would keep her away from healthy lifestyle    #Genetics: Discussed genetic implications-children/siblings.  Patient does not have any siblings.  Her parents are deceased.  However patient has cousins.  Recommend speak with them regarding her abnormal tests; and consideration of screening for hemochromatosis.  # Skin rash/arthritis-unrelated to her diagnosis of hemochromatosis.  Defer to PCP for further recommendations / management.  Thank you Dr. Rilla  MD for allowing me to participate in the care of your pleasant patient. Please do not hesitate to contact me with questions or concerns in the interim.  # DISPOSITION: # no labs today # follow up in 12 months- MD; 2-3 days- labs- cbc/bmp; iron studies;ferritin- Dr.B   All questions were answered. The patient knows to call the clinic with any proIblems, questions or concerns.      Cindy JONELLE Joe, MD 12/20/2024 1:08 PM

## 2024-12-20 NOTE — Progress Notes (Signed)
 Patient states she has a itchy rash on her legs, chest and abdomen.

## 2024-12-20 NOTE — Assessment & Plan Note (Addendum)
#   Hereditary hemochromatosis-HETEROZYGOUS FOR THE C282Y AND H63D PATHOGENIC VARIANTS. Patient is currently asymptomatic no evidence of organ dysfunction. FERRITIN: 200-300; Iron saturations: 50% [DEC 2025]. # I reviewed the genetic testing that it is consistent with a diagnosis of HH  in an individual with ONLY with clinical evidence of HH. However, this  genotype does not predict a diagnosis of HH in an asymptomatic individual, as only 0.5% to 2% of individuals  with this genotype will develop symptoms or clinical evidence of this disorder.   #I had a long discussion the patient regarding the natural history of untreated homozygous hereditary hemochromatosis-[which patient does not have] when unchecked iron levels could cause deposition in the skin/pigmentation; pancreas/diabetes; liver/cirrhosis; joints/arthralgias; cardiac/arrhythmia; hypothalamus/testis-fatigue hypogonadism etc.    #I reemphasized with the patient above complications are extremely rare; and they tend to happen when extreme iron levels go unchecked for years. Discussed treatments include phlebotomy every 6-12 months or so; to keep the ferritin < 200.  For now recommend phlebotomy-to help reduce the iron stores.  I think is reasonable to avoid multivitamin that has iron in it; however would not recommend any major dietary changes that would keep her away from healthy lifestyle    #Genetics: Discussed genetic implications-children/siblings.  Patient does not have any siblings.  Her parents are deceased.  However patient has cousins.  Recommend speak with them regarding her abnormal tests; and consideration of screening for hemochromatosis.  # Skin rash/arthritis-unrelated to her diagnosis of hemochromatosis.  Defer to PCP for further recommendations / management.  Thank you Dr. Rilla  MD for allowing me to participate in the care of your pleasant patient. Please do not hesitate to contact me with questions or concerns in the  interim.  # DISPOSITION: # no labs today # follow up in 12 months- MD; 2-3 days- labs- cbc/bmp; iron studies;ferritin- Dr.B

## 2024-12-22 ENCOUNTER — Ambulatory Visit: Payer: Self-pay | Admitting: Family Medicine

## 2024-12-22 ENCOUNTER — Inpatient Hospital Stay: Admitting: Internal Medicine

## 2024-12-22 ENCOUNTER — Inpatient Hospital Stay

## 2024-12-22 ENCOUNTER — Ambulatory Visit
Admission: RE | Admit: 2024-12-22 | Discharge: 2024-12-22 | Disposition: A | Discharge status: Home / Self Care | Payer: Self-pay | Source: Ambulatory Visit | Attending: Family Medicine | Admitting: Family Medicine

## 2024-12-22 DIAGNOSIS — E785 Hyperlipidemia, unspecified: Secondary | ICD-10-CM | POA: Insufficient documentation

## 2024-12-26 ENCOUNTER — Ambulatory Visit: Payer: Self-pay | Admitting: Family Medicine

## 2024-12-26 DIAGNOSIS — E785 Hyperlipidemia, unspecified: Secondary | ICD-10-CM

## 2024-12-26 MED ORDER — ATORVASTATIN CALCIUM 20 MG PO TABS: 20.0000 mg | ORAL_TABLET | Freq: Every day | ORAL | 11 refills | Status: AC

## 2024-12-28 NOTE — Telephone Encounter (Signed)
 Coding is correct per billing, patient had additional things done which added the additional charges.

## 2025-01-08 ENCOUNTER — Other Ambulatory Visit: Payer: Self-pay | Admitting: Family Medicine

## 2025-01-08 DIAGNOSIS — E785 Hyperlipidemia, unspecified: Secondary | ICD-10-CM

## 2025-01-08 DIAGNOSIS — M85851 Other specified disorders of bone density and structure, right thigh: Secondary | ICD-10-CM

## 2025-01-11 ENCOUNTER — Other Ambulatory Visit

## 2025-04-11 ENCOUNTER — Ambulatory Visit: Admitting: Family Medicine

## 2025-12-24 ENCOUNTER — Inpatient Hospital Stay

## 2025-12-26 ENCOUNTER — Inpatient Hospital Stay: Admitting: Internal Medicine
